# Patient Record
Sex: Female | Born: 1985 | Race: Black or African American | Hispanic: No | Marital: Single | Smoking: Current every day smoker
Health system: Southern US, Community
[De-identification: ages and names within clinical notes are randomized; demographics above are authoritative.]

## PROBLEM LIST (undated history)

## (undated) DIAGNOSIS — F329 Major depressive disorder, single episode, unspecified: Secondary | ICD-10-CM

## (undated) DIAGNOSIS — H53412 Scotoma involving central area, left eye: Secondary | ICD-10-CM

## (undated) DIAGNOSIS — F419 Anxiety disorder, unspecified: Secondary | ICD-10-CM

## (undated) DIAGNOSIS — F32A Depression, unspecified: Secondary | ICD-10-CM

## (undated) HISTORY — PX: TUBAL LIGATION: SHX77

---

## 2013-09-23 ENCOUNTER — Encounter (HOSPITAL_BASED_OUTPATIENT_CLINIC_OR_DEPARTMENT_OTHER): Payer: Self-pay | Admitting: Emergency Medicine

## 2013-09-23 ENCOUNTER — Emergency Department (HOSPITAL_BASED_OUTPATIENT_CLINIC_OR_DEPARTMENT_OTHER)
Admission: EM | Admit: 2013-09-23 | Discharge: 2013-09-23 | Disposition: A | Discharge status: Home / Self Care | Payer: Medicaid Other | Attending: Emergency Medicine | Admitting: Emergency Medicine

## 2013-09-23 DIAGNOSIS — Z8659 Personal history of other mental and behavioral disorders: Secondary | ICD-10-CM | POA: Insufficient documentation

## 2013-09-23 DIAGNOSIS — R059 Cough, unspecified: Secondary | ICD-10-CM | POA: Insufficient documentation

## 2013-09-23 DIAGNOSIS — M542 Cervicalgia: Secondary | ICD-10-CM | POA: Insufficient documentation

## 2013-09-23 DIAGNOSIS — R05 Cough: Secondary | ICD-10-CM | POA: Insufficient documentation

## 2013-09-23 DIAGNOSIS — K0889 Other specified disorders of teeth and supporting structures: Secondary | ICD-10-CM

## 2013-09-23 DIAGNOSIS — K089 Disorder of teeth and supporting structures, unspecified: Secondary | ICD-10-CM | POA: Insufficient documentation

## 2013-09-23 DIAGNOSIS — F17200 Nicotine dependence, unspecified, uncomplicated: Secondary | ICD-10-CM | POA: Insufficient documentation

## 2013-09-23 DIAGNOSIS — H9209 Otalgia, unspecified ear: Secondary | ICD-10-CM | POA: Insufficient documentation

## 2013-09-23 HISTORY — DX: Anxiety disorder, unspecified: F41.9

## 2013-09-23 MED ORDER — HYDROCODONE-ACETAMINOPHEN 5-325 MG PO TABS: 2.0000 | ORAL_TABLET | ORAL | Status: DC | PRN

## 2013-09-23 MED ORDER — AMOXICILLIN 500 MG PO CAPS: 500.0000 mg | ORAL_CAPSULE | Freq: Three times a day (TID) | ORAL | Status: AC

## 2013-09-23 MED ORDER — DOCUSATE SODIUM 100 MG PO CAPS
100.0000 mg | ORAL_CAPSULE | Freq: Once | ORAL | Status: AC
  Administered 2013-09-23: 100 mg via ORAL
  Filled 2013-09-23: qty 1

## 2013-09-23 NOTE — ED Provider Notes (Signed)
Medical screening examination/treatment/procedure(s) were performed by non-physician practitioner and as supervising physician I was immediately available for consultation/collaboration.     Geoffery Lyonsouglas Zaheer Wageman, MD 09/23/13 [phone]

## 2013-09-23 NOTE — Discharge Instructions (Signed)

## 2013-09-23 NOTE — ED Provider Notes (Signed)
CSN: [id number]     Arrival date & time 09/23/13  1153 History   First MD Initiated Contact with Patient 09/23/13 1214     Chief Complaint  Patient presents with  . congestion ear pain neck pain      (Consider location/radiation/quality/duration/timing/severity/associated sxs/prior Treatment) Patient is a 28 y.o. female presenting with cough. The history is provided by the patient. No language interpreter was used.  Cough Cough characteristics:  Non-productive Severity:  Moderate Onset quality:  Gradual Timing:  Constant Progression:  Worsening Chronicity:  New Smoker: no   Relieved by:  Nothing Worsened by:  Nothing tried Ineffective treatments:  None tried Associated symptoms: ear pain and rhinorrhea   Associated symptoms: no fever     Past Medical History  Diagnosis Date  . Anxiety    No past surgical history on file. No family history on file. History  Substance Use Topics  . Smoking status: Current Every Day Smoker  . Smokeless tobacco: Not on file  . Alcohol Use: Yes     Comment: occ   OB History   Grav Para Term Preterm Abortions TAB SAB Ect Mult Living                 Review of Systems  Constitutional: Negative for fever.  HENT: Positive for dental problem, ear pain and rhinorrhea.   Respiratory: Positive for cough.   All other systems reviewed and are negative.      Allergies  Review of patient's allergies indicates no known allergies.  Home Medications  No current outpatient prescriptions on file. BP 154/92  Pulse 88  Temp(Src) 98.3 F (36.8 C) (Oral)  Resp 14  Ht 4\' 11"  (1.499 m)  Wt 105 lb (47.628 kg)  BMI 21.20 kg/m2  SpO2 100%  LMP 09/10/2013 Physical Exam  Nursing note and vitals reviewed. Constitutional: She appears well-developed.  HENT:  Head: Normocephalic and atraumatic.  Right Ear: External ear normal.  Left Ear: External ear normal.  Mouth/Throat: Oropharynx is clear and moist.  Right tm occluded with wax,  Broken  posterior upper molar right  Eyes: Conjunctivae are normal. Pupils are equal, round, and reactive to light.  Neck: Normal range of motion. Neck supple.  Cardiovascular: Normal rate and normal heart sounds.   Pulmonary/Chest: Effort normal.  Abdominal: Soft.  Musculoskeletal: Normal range of motion.  Neurological: She is alert.  Skin: Skin is warm.    ED Course  Procedures (including critical care time) Labs Review Labs Reviewed - No data to display Imaging Review No results found.   EKG Interpretation None      MDM   Final diagnoses:  Toothache    amoxicillian and hydrocodone    Elson AreasLeslie K Reinaldo Helt, PA-C 09/23/13 1337

## 2013-09-23 NOTE — ED Notes (Signed)
Several days of congestion ,sinus pressure, right ear pain that radiates down to neck.

## 2013-11-18 ENCOUNTER — Emergency Department (HOSPITAL_BASED_OUTPATIENT_CLINIC_OR_DEPARTMENT_OTHER)
Admission: EM | Admit: 2013-11-18 | Discharge: 2013-11-18 | Disposition: A | Discharge status: Home / Self Care | Payer: Medicaid Other | Attending: Emergency Medicine | Admitting: Emergency Medicine

## 2013-11-18 ENCOUNTER — Encounter (HOSPITAL_BASED_OUTPATIENT_CLINIC_OR_DEPARTMENT_OTHER): Payer: Self-pay | Admitting: Emergency Medicine

## 2013-11-18 DIAGNOSIS — Z8659 Personal history of other mental and behavioral disorders: Secondary | ICD-10-CM | POA: Insufficient documentation

## 2013-11-18 DIAGNOSIS — F17200 Nicotine dependence, unspecified, uncomplicated: Secondary | ICD-10-CM | POA: Insufficient documentation

## 2013-11-18 DIAGNOSIS — D649 Anemia, unspecified: Secondary | ICD-10-CM

## 2013-11-18 DIAGNOSIS — W57XXXA Bitten or stung by nonvenomous insect and other nonvenomous arthropods, initial encounter: Secondary | ICD-10-CM

## 2013-11-18 DIAGNOSIS — S90569A Insect bite (nonvenomous), unspecified ankle, initial encounter: Secondary | ICD-10-CM | POA: Insufficient documentation

## 2013-11-18 DIAGNOSIS — Y939 Activity, unspecified: Secondary | ICD-10-CM | POA: Insufficient documentation

## 2013-11-18 DIAGNOSIS — J029 Acute pharyngitis, unspecified: Secondary | ICD-10-CM | POA: Insufficient documentation

## 2013-11-18 DIAGNOSIS — Y929 Unspecified place or not applicable: Secondary | ICD-10-CM | POA: Insufficient documentation

## 2013-11-18 LAB — CBC WITH DIFFERENTIAL/PLATELET
BASOS ABS: 0 10*3/uL (ref 0.0–0.1)
Basophils Relative: 0 % (ref 0–1)
Eosinophils Absolute: 0.3 10*3/uL (ref 0.0–0.7)
Eosinophils Relative: 5 % (ref 0–5)
HCT: 27.7 % — ABNORMAL LOW (ref 36.0–46.0)
Hemoglobin: 9 g/dL — ABNORMAL LOW (ref 12.0–15.0)
LYMPHS ABS: 2.4 10*3/uL (ref 0.7–4.0)
LYMPHS PCT: 36 % (ref 12–46)
MCH: 28.1 pg (ref 26.0–34.0)
MCHC: 32.5 g/dL (ref 30.0–36.0)
MCV: 86.6 fL (ref 78.0–100.0)
Monocytes Absolute: 0.5 10*3/uL (ref 0.1–1.0)
Monocytes Relative: 8 % (ref 3–12)
NEUTROS ABS: 3.4 10*3/uL (ref 1.7–7.7)
NEUTROS PCT: 51 % (ref 43–77)
PLATELETS: 241 10*3/uL (ref 150–400)
RBC: 3.2 MIL/uL — AB (ref 3.87–5.11)
RDW: 14.4 % (ref 11.5–15.5)
WBC: 6.7 10*3/uL (ref 4.0–10.5)

## 2013-11-18 LAB — RAPID STREP SCREEN (MED CTR MEBANE ONLY): STREPTOCOCCUS, GROUP A SCREEN (DIRECT): NEGATIVE

## 2013-11-18 MED ORDER — LORATADINE 10 MG PO TABS: 10.0000 mg | ORAL_TABLET | Freq: Every day | ORAL | Status: DC

## 2013-11-18 MED ORDER — FERROUS SULFATE 325 (65 FE) MG PO TABS: 325.0000 mg | ORAL_TABLET | Freq: Every day | ORAL | Status: DC

## 2013-11-18 MED ORDER — MUPIROCIN CALCIUM 2 % EX CREA: 1.0000 "application " | TOPICAL_CREAM | Freq: Two times a day (BID) | CUTANEOUS | Status: DC

## 2013-11-18 NOTE — ED Notes (Signed)
rx x 3 given for claritin, iron and bactroban- pt d/c home with friend

## 2013-11-18 NOTE — ED Provider Notes (Signed)
CSN: [id number]     Arrival date & time 11/18/13  1812 History   First MD Initiated Contact with Patient 11/18/13 1822     Chief Complaint  Patient presents with  . Sore Throat     (Consider location/radiation/quality/duration/timing/severity/associated sxs/prior Treatment) Patient is a 28 y.o. female presenting with pharyngitis. The history is provided by the patient.  Sore Throat This is a new problem. The current episode started yesterday. The problem occurs constantly. The problem has been gradually worsening. Associated symptoms include fatigue, headaches, a rash, a sore throat and swollen glands. Pertinent negatives include no abdominal pain, anorexia, change in bowel habit, congestion, coughing, fever, myalgias, nausea, urinary symptoms, visual change or vomiting. Nothing aggravates the symptoms. She has tried acetaminophen and NSAIDs for the symptoms. The treatment provided mild relief.   Stephanie Jefferson is a 28 y.o. female who presents to the ED with a sore throat that started 2 days ago. She states that she is taking OTC medication without relief. She also complains of areas on her lower legs that are rased rash that may be insect bites. Patient states that she is tired all the time.    Past Medical History  Diagnosis Date  . Anxiety    Past Surgical History  Procedure Laterality Date  . Cesarean section    . Tubal ligation     History reviewed. No pertinent family history. History  Substance Use Topics  . Smoking status: Current Every Day Smoker -- 0.50 packs/day    Types: Cigarettes  . Smokeless tobacco: Not on file  . Alcohol Use: Yes     Comment: occ   OB History   Grav Para Term Preterm Abortions TAB SAB Ect Mult Living                 Review of Systems  Constitutional: Positive for fatigue. Negative for fever.  HENT: Positive for sore throat. Negative for congestion, ear pain, facial swelling and sinus pressure.   Eyes: Negative for visual disturbance.   Respiratory: Negative for cough.   Gastrointestinal: Negative for nausea, vomiting, abdominal pain, anorexia and change in bowel habit.  Genitourinary: Negative for dysuria, urgency, frequency and vaginal discharge.  Musculoskeletal: Negative for myalgias.  Skin: Positive for rash.  Neurological: Positive for headaches.  Psychiatric/Behavioral: Negative for confusion. The patient is not nervous/anxious.       Allergies  Review of patient's allergies indicates no known allergies.  Home Medications   Prior to Admission medications   Medication Sig Start Date End Date Taking? Authorizing Provider  HYDROcodone-acetaminophen (NORCO/VICODIN) 5-325 MG per tablet Take 2 tablets by mouth every 4 (four) hours as needed. 09/23/13   Elson AreasLeslie K Sofia, PA-C   BP 124/70  Pulse 83  Temp(Src) 98.3 F (36.8 C) (Oral)  Resp 16  Ht 4\' 11"  (1.499 m)  Wt 105 lb (47.628 kg)  BMI 21.20 kg/m2  SpO2 100%  LMP 11/11/2013 Physical Exam  Nursing note and vitals reviewed. Constitutional: She is oriented to person, place, and time. She appears well-developed and well-nourished. No distress.  HENT:  Head: Normocephalic and atraumatic.  Right Ear: Tympanic membrane normal.  Left Ear: Tympanic membrane normal.  Nose: Nose normal.  Mouth/Throat: Uvula is midline and mucous membranes are normal. Posterior oropharyngeal erythema present.  Eyes: EOM are normal.  Neck: Neck supple.  Cardiovascular: Normal rate and regular rhythm.   Pulmonary/Chest: Effort normal. She has no wheezes. She has no rales.  Abdominal: Soft. Bowel sounds are normal. There is  no tenderness.  Musculoskeletal: Normal range of motion.  Lymphadenopathy:    She has no cervical adenopathy.  Neurological: She is alert and oriented to person, place, and time. No cranial nerve deficit.  Skin: Skin is warm and dry.  There are small raised areas to the lower extremities that itch and appear as insect bites.  Psychiatric: She has a normal  mood and affect. Her behavior is normal.    ED Course  Procedures (including critical care time) Labs Review Results for orders placed during the hospital encounter of 11/18/13 (from the past 24 hour(s))  RAPID STREP SCREEN     Status: None   Collection Time    11/18/13  6:17 PM      Result Value Ref Range   Streptococcus, Group A Screen (Direct) NEGATIVE  NEGATIVE  CBC WITH DIFFERENTIAL     Status: Abnormal   Collection Time    11/18/13  7:30 PM      Result Value Ref Range   WBC 6.7  4.0 - 10.5 K/uL   RBC 3.20 (*) 3.87 - 5.11 MIL/uL   Hemoglobin 9.0 (*) 12.0 - 15.0 g/dL   HCT 16.127.7 (*) 09.636.0 - 04.546.0 %   MCV 86.6  78.0 - 100.0 fL   MCH 28.1  26.0 - 34.0 pg   MCHC 32.5  30.0 - 36.0 g/dL   RDW 40.914.4  81.111.5 - 91.415.5 %   Platelets 241  150 - 400 K/uL   Neutrophils Relative % 51  43 - 77 %   Neutro Abs 3.4  1.7 - 7.7 K/uL   Lymphocytes Relative 36  12 - 46 %   Lymphs Abs 2.4  0.7 - 4.0 K/uL   Monocytes Relative 8  3 - 12 %   Monocytes Absolute 0.5  0.1 - 1.0 K/uL   Eosinophils Relative 5  0 - 5 %   Eosinophils Absolute 0.3  0.0 - 0.7 K/uL   Basophils Relative 0  0 - 1 %   Basophils Absolute 0.0  0.0 - 0.1 K/uL    MDM  28 y.o. female with sore throat x 2 days and feeling tired all the time. Will treat for anemia, viral pharyngitis and insect bites. She will follow up with her PCP or return here as needed. I have reviewed this patient's vital signs, nurses notes, appropriate labs and imaging.  I have discussed findings and plan of care with the patient and she voices understanding. Stable for discharge without further screening at this time.   Medication List    TAKE these medications       ferrous sulfate 325 (65 FE) MG tablet  Take 1 tablet (325 mg total) by mouth daily.     loratadine 10 MG tablet  Commonly known as:  CLARITIN  Take 1 tablet (10 mg total) by mouth daily.     mupirocin cream 2 %  Commonly known as:  BACTROBAN  Apply 1 application topically 2 (two) times  daily.      ASK your doctor about these medications       HYDROcodone-acetaminophen 5-325 MG per tablet  Commonly known as:  NORCO/VICODIN  Take 2 tablets by mouth every 4 (four) hours as needed.         [id number] CircleHope MedwayM Stephanie Jefferson, [place] 11/20/13 [phone]

## 2013-11-18 NOTE — Discharge Instructions (Signed)
It is important that you follow up with your doctor for your anemia. Take the medications as directed. Return as needed for problems.

## 2013-11-18 NOTE — ED Notes (Signed)
Pt c/o sore throat x2 days 

## 2013-11-20 LAB — CULTURE, GROUP A STREP

## 2013-11-20 NOTE — ED Provider Notes (Signed)
  Medical screening examination/treatment/procedure(s) were performed by non-physician practitioner and as supervising physician I was immediately available for consultation/collaboration.      Cledith Kamiya, MD 11/20/13 1534 

## 2013-12-28 ENCOUNTER — Emergency Department (HOSPITAL_BASED_OUTPATIENT_CLINIC_OR_DEPARTMENT_OTHER): Payer: Medicaid Other

## 2013-12-28 ENCOUNTER — Emergency Department (HOSPITAL_BASED_OUTPATIENT_CLINIC_OR_DEPARTMENT_OTHER)
Admission: EM | Admit: 2013-12-28 | Discharge: 2013-12-28 | Disposition: A | Discharge status: Home / Self Care | Payer: Medicaid Other | Attending: Emergency Medicine | Admitting: Emergency Medicine

## 2013-12-28 ENCOUNTER — Encounter (HOSPITAL_BASED_OUTPATIENT_CLINIC_OR_DEPARTMENT_OTHER): Payer: Self-pay | Admitting: Emergency Medicine

## 2013-12-28 DIAGNOSIS — F17200 Nicotine dependence, unspecified, uncomplicated: Secondary | ICD-10-CM | POA: Insufficient documentation

## 2013-12-28 DIAGNOSIS — H538 Other visual disturbances: Secondary | ICD-10-CM | POA: Insufficient documentation

## 2013-12-28 DIAGNOSIS — R519 Headache, unspecified: Secondary | ICD-10-CM

## 2013-12-28 DIAGNOSIS — Z3202 Encounter for pregnancy test, result negative: Secondary | ICD-10-CM | POA: Insufficient documentation

## 2013-12-28 DIAGNOSIS — Z79899 Other long term (current) drug therapy: Secondary | ICD-10-CM | POA: Insufficient documentation

## 2013-12-28 DIAGNOSIS — R51 Headache: Secondary | ICD-10-CM | POA: Insufficient documentation

## 2013-12-28 DIAGNOSIS — Z792 Long term (current) use of antibiotics: Secondary | ICD-10-CM | POA: Insufficient documentation

## 2013-12-28 DIAGNOSIS — Z8659 Personal history of other mental and behavioral disorders: Secondary | ICD-10-CM | POA: Insufficient documentation

## 2013-12-28 LAB — PREGNANCY, URINE: PREG TEST UR: NEGATIVE

## 2013-12-28 MED ORDER — METOCLOPRAMIDE HCL 10 MG PO TABS
10.0000 mg | ORAL_TABLET | Freq: Once | ORAL | Status: AC
  Administered 2013-12-28: 10 mg via ORAL
  Filled 2013-12-28: qty 1

## 2013-12-28 MED ORDER — DIPHENHYDRAMINE HCL 25 MG PO CAPS
25.0000 mg | ORAL_CAPSULE | Freq: Once | ORAL | Status: AC
  Administered 2013-12-28: 25 mg via ORAL
  Filled 2013-12-28: qty 1

## 2013-12-28 NOTE — ED Notes (Signed)
Pt ambulating to restroom at this time.

## 2013-12-28 NOTE — ED Provider Notes (Signed)
CSN: [id number]     Arrival date & time 12/28/13  1415 History  This chart was scribed for Stephanie Argyle, MD by Quintella Reichert, ED scribe.  This patient was seen in room MH08/MH08 and the patient's care was started at 3:23 PM.    Chief Complaint  Patient presents with  . Eye Problem     Patient is a 28 y.o. female presenting with eye problem. The history is provided by the patient. No language interpreter was used.  Eye Problem Location:  L eye Quality: blurry vision. Severity:  Mild Duration:  4 days Timing:  Constant Chronicity:  New Context comment:  Slept with arm over eye Associated symptoms: headaches     HPI Comments: Stephanie Jefferson is a 28 y.o. female who presents to the Emergency Department complaining of recurrent headaches for the past two years. She reports experiencing headaches 4-5 days per week, but does not currently have a headache. She reports a sharp pain on her in her right temporal region beginning a few minutes ago and rates this pain as a 7/10 in severity. Patient says this pain is similar to what she typically experiences before developing a headache. She takes OTC ibuprofen, which helps relieve the pain, but she is concerned that she takes it too frequently. She denies fever, vomiting, diarrhea, dysuria, or any other associated symptoms. Patient has never received imaging of her head.    She also complains of blurry vision in her left eye. Patient states that she slept with her arm pressing against this eye 3-4 nights ago and awoke with mild blurred vision in that eye the next morning. She denies eye pain or loss of vision. She does not see an optometrist.   Past Medical History  Diagnosis Date  . Anxiety    Past Surgical History  Procedure Laterality Date  . Cesarean section    . Tubal ligation     No family history on file. History  Substance Use Topics  . Smoking status: Current Every Day Smoker -- 0.50 packs/day    Types: Cigarettes  .  Smokeless tobacco: Not on file  . Alcohol Use: Yes     Comment: occ   OB History   Grav Para Term Preterm Abortions TAB SAB Ect Mult Living                 Review of Systems  Constitutional: Negative for fever and chills.  HENT: Negative for congestion, rhinorrhea and sore throat.   Eyes: Positive for visual disturbance.  Respiratory: Negative for cough and shortness of breath.   Cardiovascular: Negative for chest pain and leg swelling.  Gastrointestinal: Negative for abdominal pain.  Genitourinary: Negative for dysuria.  Musculoskeletal: Negative for back pain and neck pain.  Skin: Negative for rash.  Neurological: Positive for headaches.  Hematological: Does not bruise/bleed easily.  Psychiatric/Behavioral: Negative for confusion.      Allergies  Review of patient's allergies indicates no known allergies.  Home Medications   Prior to Admission medications   Medication Sig Start Date End Date Taking? Authorizing Provider  ferrous sulfate 325 (65 FE) MG tablet Take 1 tablet (325 mg total) by mouth daily. 11/18/13   Hope Orlene Och, NP  HYDROcodone-acetaminophen (NORCO/VICODIN) 5-325 MG per tablet Take 2 tablets by mouth every 4 (four) hours as needed. 09/23/13   Elson Areas, PA-C  loratadine (CLARITIN) 10 MG tablet Take 1 tablet (10 mg total) by mouth daily. 11/18/13   Hope Orlene Och, NP  mupirocin cream (BACTROBAN) 2 % Apply 1 application topically 2 (two) times daily. 11/18/13   Hope Orlene OchM Neese, NP   BP 120/75  Pulse 73  Temp(Src) 98 F (36.7 C) (Oral)  Resp 16  Ht 4\' 11"  (1.499 m)  Wt 103 lb (46.72 kg)  BMI 20.79 kg/m2  SpO2 100%  Physical Exam  Nursing note and vitals reviewed. Constitutional: She is oriented to person, place, and time. She appears well-developed and well-nourished. No distress.  HENT:  Head: Normocephalic and atraumatic.  Right Ear: External ear normal.  Left Ear: External ear normal.  Mouth/Throat: Oropharynx is clear and moist.  TM's clear  bilaterally  Eyes: Conjunctivae and EOM are normal. Pupils are equal, round, and reactive to light.  20/20 in right eye 20/50 in left eye  Normal appearing conjunctiva. Normal peripheral fields.   Neck: Normal range of motion. Neck supple. No tracheal deviation present.  Cardiovascular: Normal rate, regular rhythm and normal heart sounds.  Exam reveals no gallop and no friction rub.   No murmur heard. Pulmonary/Chest: Effort normal and breath sounds normal. No respiratory distress. She has no wheezes. She has no rales.  Abdominal: Soft. Bowel sounds are normal. She exhibits no distension and no mass. There is no tenderness. There is no rebound and no guarding.  Musculoskeletal: Normal range of motion.  Neurological: She is alert and oriented to person, place, and time. She has normal strength. No sensory deficit.  Skin: Skin is warm and dry.  Psychiatric: She has a normal mood and affect. Her behavior is normal.    ED Course  Procedures (including critical care time) DIAGNOSTIC STUDIES: Oxygen Saturation is 100% on room air, normal by my interpretation.    COORDINATION OF CARE: 3:29 PM-Discussed treatment plan which includes head CT, migraine cocktail, and fluorescein exam with pt at bedside and pt agreed to plan.     Labs Review Labs Reviewed - No data to display  Imaging Review No results found.   EKG Interpretation None      MDM   Final diagnoses:  Headache  Blurry vision, left eye    3:57 PM 28 y.o. female w hx of HA's and blurry vision in Left eye for 4-5 days after sleeping w/ her arm over her eyes. Has had this before and it went away spontaneously. Will get CT of head do to chronic headaches for 2 years. Doubt corneal abrasion given absence of the eye pain. She did not want an IV or IV fluids. Will give her an oral headache cocktail. She denies any other visual disturbances such as seeing spots or visual field loss. Doubt detached retina, stroke, or other serious  ocular pathology.  5:02 PM: I interpreted/reviewed the labs and/or imaging which were non-contributory.   I have discussed the diagnosis/risks/treatment options with the patient and believe the pt to be eligible for discharge home to follow-up with an optometrist next week for eye exam. We also discussed returning to the ED immediately if new or worsening sx occur. We discussed the sx which are most concerning (e.g., fever, eye pain, redness, worsening vision) that necessitate immediate return. Medications administered to the patient during their visit and any new prescriptions provided to the patient are listed below.  Medications given during this visit Medications  metoCLOPramide (REGLAN) tablet 10 mg (10 mg Oral Given by Other 12/28/13 1542)  diphenhydrAMINE (BENADRYL) capsule 25 mg (25 mg Oral Given 12/28/13 1541)    New Prescriptions   No medications on file  I personally performed the services described in this documentation, which was scribed in my presence. The recorded information has been reviewed and is accurate.    Stephanie ArgyleForrest S Areona Homer, MD 12/28/13 [phone]

## 2013-12-28 NOTE — ED Notes (Signed)
MD at bedside. 

## 2013-12-28 NOTE — Discharge Instructions (Signed)
Blurred Vision °You have been seen today complaining of blurred vision. This means you have a loss of ability to see small details.  °CAUSES  °Blurred vision can be a symptom of underlying eye problems, such as: °· Aging of the eye (presbyopia). °· Glaucoma. °· Cataracts. °· Eye infection. °· Eye-related migraine. °· Diabetes mellitus. °· Fatigue. °· Migraine headaches. °· High blood pressure. °· Breakdown of the back of the eye (macular degeneration). °· Problems caused by some medications. °The most common cause of blurred vision is the need for eyeglasses or a new prescription. Today in the emergency department, no cause for your blurred vision can be found. °SYMPTOMS  °Blurred vision is the loss of visual sharpness and detail (acuity). °DIAGNOSIS  °Should blurred vision continue, you should see your caregiver. If your caregiver is your primary care physician, he or she may choose to refer you to another specialist.  °TREATMENT  °Do not ignore your blurred vision. Make sure to have it checked out to see if further treatment or referral is necessary. °SEEK MEDICAL CARE IF:  °You are unable to get into a specialist so we can help you with a referral. °SEEK IMMEDIATE MEDICAL CARE IF: °You have severe eye pain, severe headache, or sudden loss of vision. °MAKE SURE YOU:  °· Understand these instructions. °· Will watch your condition. °· Will get help right away if you are not doing well or get worse. °Document Released: 07/06/2003 Document Revised: 09/25/2011 Document Reviewed: 02/05/2008 °[employer]® Patient Information ©2014 [employer]. ° °

## 2013-12-28 NOTE — ED Notes (Signed)
Pt reports that she has had blurry vision in the left eye x 3 days.  Reports HA. Also reports right ear pain.

## 2013-12-28 NOTE — ED Notes (Signed)
Patient states that 4 days ago she woke up with blurry vision in her right eye, headache and ear ache on the right side as well.

## 2014-08-10 ENCOUNTER — Emergency Department (HOSPITAL_BASED_OUTPATIENT_CLINIC_OR_DEPARTMENT_OTHER): Payer: Medicaid Other

## 2014-08-10 ENCOUNTER — Emergency Department (HOSPITAL_BASED_OUTPATIENT_CLINIC_OR_DEPARTMENT_OTHER)
Admission: EM | Admit: 2014-08-10 | Discharge: 2014-08-10 | Disposition: A | Discharge status: Home / Self Care | Payer: Medicaid Other | Attending: Emergency Medicine | Admitting: Emergency Medicine

## 2014-08-10 ENCOUNTER — Encounter (HOSPITAL_BASED_OUTPATIENT_CLINIC_OR_DEPARTMENT_OTHER): Payer: Self-pay | Admitting: *Deleted

## 2014-08-10 DIAGNOSIS — Z720 Tobacco use: Secondary | ICD-10-CM | POA: Diagnosis not present

## 2014-08-10 DIAGNOSIS — R202 Paresthesia of skin: Secondary | ICD-10-CM

## 2014-08-10 DIAGNOSIS — Z8659 Personal history of other mental and behavioral disorders: Secondary | ICD-10-CM | POA: Diagnosis not present

## 2014-08-10 DIAGNOSIS — M79671 Pain in right foot: Secondary | ICD-10-CM | POA: Diagnosis present

## 2014-08-10 DIAGNOSIS — Z79899 Other long term (current) drug therapy: Secondary | ICD-10-CM | POA: Insufficient documentation

## 2014-08-10 DIAGNOSIS — R52 Pain, unspecified: Secondary | ICD-10-CM

## 2014-08-10 NOTE — ED Provider Notes (Signed)
CSN: [id number]     Arrival date & time 08/10/14  1528 History   First MD Initiated Contact with Patient 08/10/14 1535     Chief Complaint  Patient presents with  . Foot Pain     (Consider location/radiation/quality/duration/timing/severity/associated sxs/prior Treatment) HPI Comments: Pt comes in today with c/o right heel numbness paresthesia for the last 4 days. Pt states that she she has not had any injury to the area. She states that she does have some pain to the area intermittently. No redness or warmth noted. Not having any problems with walking. Pt states that she has over the last couple of months has intermittent numbness to different extremities  The history is provided by the patient. No language interpreter was used.    Past Medical History  Diagnosis Date  . Anxiety    Past Surgical History  Procedure Laterality Date  . Cesarean section    . Tubal ligation     History reviewed. No pertinent family history. History  Substance Use Topics  . Smoking status: Current Every Day Smoker -- 0.50 packs/day    Types: Cigarettes  . Smokeless tobacco: Not on file  . Alcohol Use: Yes     Comment: occ   OB History    No data available     Review of Systems  All other systems reviewed and are negative.     Allergies  Review of patient's allergies indicates no known allergies.  Home Medications   Prior to Admission medications   Medication Sig Start Date End Date Taking? Authorizing Provider  ferrous sulfate 325 (65 FE) MG tablet Take 1 tablet (325 mg total) by mouth daily. 11/18/13   Hope Orlene Och, NP  HYDROcodone-acetaminophen (NORCO/VICODIN) 5-325 MG per tablet Take 2 tablets by mouth every 4 (four) hours as needed. 09/23/13   Elson Areas, PA-C  loratadine (CLARITIN) 10 MG tablet Take 1 tablet (10 mg total) by mouth daily. 11/18/13   Hope Orlene Och, NP  mupirocin cream (BACTROBAN) 2 % Apply 1 application topically 2 (two) times daily. 11/18/13   Hope Orlene Och, NP   BP  156/85 mmHg  Pulse 78  Temp(Src) 98.5 F (36.9 C) (Oral)  Resp 16  Ht  (1.499 m)  Wt 109 lb (49.442 kg)  BMI 22.00 kg/m2  SpO2 100%  LMP 08/10/2014 Physical Exam  Constitutional: She is oriented to person, place, and time. She appears well-developed and well-nourished.  HENT:  Head: Normocephalic and atraumatic.  Cardiovascular: Normal rate and regular rhythm.   Pulmonary/Chest: Effort normal.  Musculoskeletal: Normal range of motion.  Pulse intact. No redness or warmth noted. Full rom. Good sensation  Neurological: She is alert and oriented to person, place, and time. She exhibits normal muscle tone. Coordination normal.  Skin: Skin is warm and dry.  Nursing note and vitals reviewed.   ED Course  Procedures (including critical care time) Labs Review Labs Reviewed - No data to display  Imaging Review Dg Foot Complete Right  08/10/2014   CLINICAL DATA:  Right foot numbness in the heel in toes for 4 days. No acute injury or pain. Initial encounter.  EXAM: RIGHT FOOT COMPLETE - 3+ VIEW  COMPARISON:  None.  FINDINGS: The mineralization and alignment are normal. There is no evidence of acute fracture or dislocation. The joint spaces are maintained. No focal soft tissue swelling.  IMPRESSION: Normal right foot radiographs.  No evidence of neuropathic change.   Electronically Signed   By: Roxy Horseman  M.D.   On: 08/10/2014 15:59     EKG Interpretation None      MDM   Final diagnoses:  Pain  Paresthesia    Doubt tia/stroke etc. Discussed with pt follow up with pcp do discuss ms. Pt states that she has had some vision problems and numbness in different areas over the last 6-9 months. She has been seen and had multiple tests without any answers. Discussed with pt the possibility of ms and that she needed to follow up with pcp and discuss this    Teressa LowerVrinda Yadhira Mckneely, NP 08/10/14 1630  Richardean Canalavid H Yao, MD 08/10/14 [phone]

## 2014-08-10 NOTE — ED Notes (Signed)
Pt c/o right heel numbness x 4 days

## 2014-08-10 NOTE — Discharge Instructions (Signed)
As discussed you need to follow up with your primary care doctor Paresthesia Paresthesia is an abnormal burning or prickling sensation. This sensation is generally felt in the hands, arms, legs, or feet. However, it may occur in any part of the body. It is usually not painful. The feeling may be described as:  Tingling or numbness.  "Pins and needles."  Skin crawling.  Buzzing.  Limbs "falling asleep."  Itching. Most people experience temporary (transient) paresthesia at some time in their lives. CAUSES  Paresthesia may occur when you breathe too quickly (hyperventilation). It can also occur without any apparent cause. Commonly, paresthesia occurs when pressure is placed on a nerve. The feeling quickly goes away once the pressure is removed. For some people, however, paresthesia is a long-lasting (chronic) condition caused by an underlying disorder. The underlying disorder may be:  A traumatic, direct injury to nerves. Examples include a:  Broken (fractured) neck.  Fractured skull.  A disorder affecting the brain and spinal cord (central nervous system). Examples include:  Transverse myelitis.  Encephalitis.  Transient ischemic attack.  Multiple sclerosis.  Stroke.  Tumor or blood vessel problems, such as an arteriovenous malformation pressing against the brain or spinal cord.  A condition that damages the peripheral nerves (peripheral neuropathy). Peripheral nerves are not part of the brain and spinal cord. These conditions include:  Diabetes.  Peripheral vascular disease.  Nerve entrapment syndromes, such as carpal tunnel syndrome.  Shingles.  Hypothyroidism.  Vitamin B12 deficiencies.  Alcoholism.  Heavy metal poisoning (lead, arsenic).  Rheumatoid arthritis.  Systemic lupus erythematosus. DIAGNOSIS  Your caregiver will attempt to find the underlying cause of your paresthesia. Your caregiver may:  Take your medical history.  Perform a physical  exam.  Order various lab tests.  Order imaging tests. TREATMENT  Treatment for paresthesia depends on the underlying cause. HOME CARE INSTRUCTIONS  Avoid drinking alcohol.  You may consider massage or acupuncture to help relieve your symptoms.  Keep all follow-up appointments as directed by your caregiver. SEEK IMMEDIATE MEDICAL CARE IF:   You feel weak.  You have trouble walking or moving.  You have problems with speech or vision.  You feel confused.  You cannot control your bladder or bowel movements.  You feel numbness after an injury.  You faint.  Your burning or prickling feeling gets worse when walking.  You have pain, cramps, or dizziness.  You develop a rash. MAKE SURE YOU:  Understand these instructions.  Will watch your condition.  Will get help right away if you are not doing well or get worse. Document Released: 06/23/2002 Document Revised: 09/25/2011 Document Reviewed: 03/24/2011 [facility] Patient Information 2015 [place], [place]. This information is not intended to replace advice given to you by your health care provider. Make sure you discuss any questions you have with your health care provider.

## 2014-08-10 NOTE — ED Notes (Signed)
Patient transported to X-ray 

## 2014-08-10 NOTE — ED Notes (Signed)
D/c home with family- f/u care discussed

## 2015-05-04 ENCOUNTER — Emergency Department (HOSPITAL_BASED_OUTPATIENT_CLINIC_OR_DEPARTMENT_OTHER)
Admission: EM | Admit: 2015-05-04 | Discharge: 2015-05-04 | Disposition: A | Discharge status: Home / Self Care | Payer: Medicaid Other | Attending: Emergency Medicine | Admitting: Emergency Medicine

## 2015-05-04 ENCOUNTER — Encounter (HOSPITAL_BASED_OUTPATIENT_CLINIC_OR_DEPARTMENT_OTHER): Payer: Self-pay

## 2015-05-04 DIAGNOSIS — Z9851 Tubal ligation status: Secondary | ICD-10-CM | POA: Diagnosis not present

## 2015-05-04 DIAGNOSIS — Z8659 Personal history of other mental and behavioral disorders: Secondary | ICD-10-CM | POA: Diagnosis not present

## 2015-05-04 DIAGNOSIS — R109 Unspecified abdominal pain: Secondary | ICD-10-CM | POA: Diagnosis present

## 2015-05-04 DIAGNOSIS — Z720 Tobacco use: Secondary | ICD-10-CM | POA: Diagnosis not present

## 2015-05-04 DIAGNOSIS — Z3202 Encounter for pregnancy test, result negative: Secondary | ICD-10-CM | POA: Diagnosis not present

## 2015-05-04 DIAGNOSIS — K529 Noninfective gastroenteritis and colitis, unspecified: Secondary | ICD-10-CM | POA: Insufficient documentation

## 2015-05-04 LAB — PREGNANCY, URINE: PREG TEST UR: NEGATIVE

## 2015-05-04 LAB — URINALYSIS, ROUTINE W REFLEX MICROSCOPIC
Bilirubin Urine: NEGATIVE
Glucose, UA: NEGATIVE mg/dL
KETONES UR: NEGATIVE mg/dL
Leukocytes, UA: NEGATIVE
NITRITE: NEGATIVE
Protein, ur: NEGATIVE mg/dL
SPECIFIC GRAVITY, URINE: 1.022 (ref 1.005–1.030)
UROBILINOGEN UA: 0.2 mg/dL (ref 0.0–1.0)
pH: 6.5 (ref 5.0–8.0)

## 2015-05-04 LAB — URINE MICROSCOPIC-ADD ON

## 2015-05-04 MED ORDER — DIPHENOXYLATE-ATROPINE 2.5-0.025 MG PO TABS: 1.0000 | ORAL_TABLET | Freq: Four times a day (QID) | ORAL | Status: AC | PRN

## 2015-05-04 NOTE — ED Notes (Signed)
MD at bedside. 

## 2015-05-04 NOTE — ED Notes (Signed)
C/o abd gas, bloating, diarrhea "feels all bubbly" since Saturday

## 2015-05-04 NOTE — ED Provider Notes (Signed)
CSN: [id number]     Arrival date & time 05/04/15  1711 History  By signing my name below, I, Gwenyth Ober, attest that this documentation has been prepared under the direction and in the presence of Nelva Nay, MD.  Electronically Signed: Gwenyth Ober, ED Scribe. 05/04/2015. 6:19 PM.   Chief Complaint  Patient presents with  . Abdominal Pain   The history is provided by the patient. No language interpreter was used.    HPI Comments: Stephanie Jefferson is a 29 y.o. female who presents to the Emergency Department complaining of constant, moderate abdominal pain that started 3 days ago. She states intermittent mild diarrhea, increased flatulence and bloating as associated symptoms. Pt also notes intermittent "bubbling" noises from her abdomen. 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 Pt has possible sick contact with similar symptoms. She has a history of 2 Cesarean sections. Pt denies vomiting and fever.  Past Medical History  Diagnosis Date  . Anxiety    Past Surgical History  Procedure Laterality Date  . Cesarean section    . Tubal ligation     No family history on file. Social History  Substance Use Topics  . Smoking status: Current Every Day Smoker -- 0.50 packs/day    Types: Cigarettes  . Smokeless tobacco: None  . Alcohol Use: Yes     Comment: occ   OB History    No data available     Review of Systems  Constitutional: Negative for fever.  Gastrointestinal: Positive for abdominal pain and diarrhea. Negative for vomiting.  All other systems reviewed and are negative.  Allergies  Review of patient's allergies indicates no known allergies.  Home Medications   Prior to Admission medications   Medication Sig Start Date End Date Taking? Authorizing Provider  diphenoxylate-atropine (LOMOTIL) 2.5-0.025 MG tablet Take 1 tablet by mouth 4 (four) times daily as needed for diarrhea or loose stools. 05/04/15   Nelva Nay, MD   BP 135/93 mmHg  Pulse 92   Temp(Src) 98.1 F (36.7 C) (Oral)  Resp 18  Ht  (1.499 m)  Wt 123 lb (55.792 kg)  BMI 24.83 kg/m2  SpO2 100%  LMP 04/20/2015 Physical Exam Physical Exam  Nursing note and vitals reviewed. Constitutional: She is oriented to person, place, and time. She appears well-developed and well-nourished. No distress.  HENT:  Head: Normocephalic and atraumatic.  Eyes: Pupils are equal, round, and reactive to light.  Neck: Normal range of motion.  Cardiovascular: Normal rate and intact distal pulses.   Pulmonary/Chest: No respiratory distress.  Abdominal: Normal appearance. She exhibits no distension.  Hyperactive bowel sounds.  No rebound or guarding tenderness.  No peritoneal signs.   Musculoskeletal: Normal range of motion.  Neurological: She is alert and oriented to person, place, and time. No cranial nerve deficit.  Skin: Skin is warm and dry. No rash noted.  Psychiatric: She has a normal mood and affect. Her behavior is normal.   ED Course  Procedures   DIAGNOSTIC STUDIES: Oxygen Saturation is 100% on RA, normal by my interpretation.    COORDINATION OF CARE: 6:18 PM Discussed suspicion for viral illness. Discussed treatment plan with pt. Pt agreed to plan.   Labs Review Labs Reviewed  URINALYSIS, ROUTINE W REFLEX MICROSCOPIC (NOT AT [facility]) - Abnormal; Notable for the following:    APPearance CLOUDY (*)    Hgb urine dipstick SMALL (*)    All other components within normal limits  URINE MICROSCOPIC-ADD ON - Abnormal; Notable for the  following:    Squamous Epithelial / LPF FEW (*)    All other components within normal limits  PREGNANCY, URINE    Imaging Review No results found. I have personally reviewed and evaluated these lab results as part of my medical decision-making.   EKG Interpretation None      MDM   Final diagnoses:  Gastroenteritis   I personally performed the services described in this documentation, which was scribed in my presence. The recorded  information has been reviewed and considered.    Nelva Nayobert Lynell Kussman, MD 05/04/15 [phone]

## 2015-05-04 NOTE — Discharge Instructions (Signed)

## 2016-01-11 IMAGING — CT CT HEAD W/O CM
1 series · 16 of 30 positions shown, 20 images · non-contrast
Comparison: None.

CLINICAL DATA: Frontal headaches for 2 years. No known injury.
Blurred vision in the left eye for 4 days.

EXAM:
CT HEAD WITHOUT CONTRAST
TECHNIQUE: Contiguous axial images were obtained from the base of the skull
through the vertex without intravenous contrast.

[Series 2: head 4.8 h37s · axial · 0.45mm/px · z∈[-157,-5]mm · 16 of 36 slices shown, 20 images]
[im 2/36  brain]
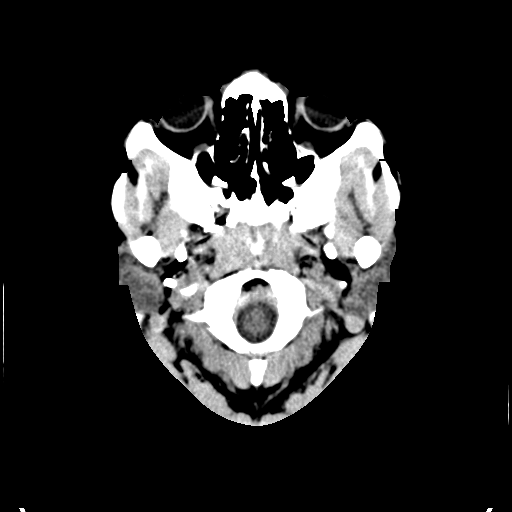
[im 2/36  bone]
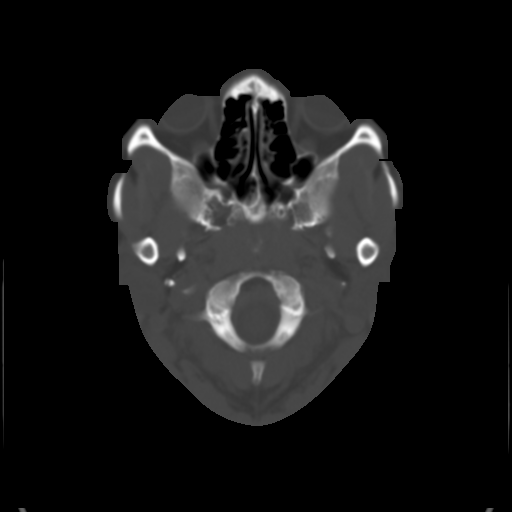
[im 4/36  brain]
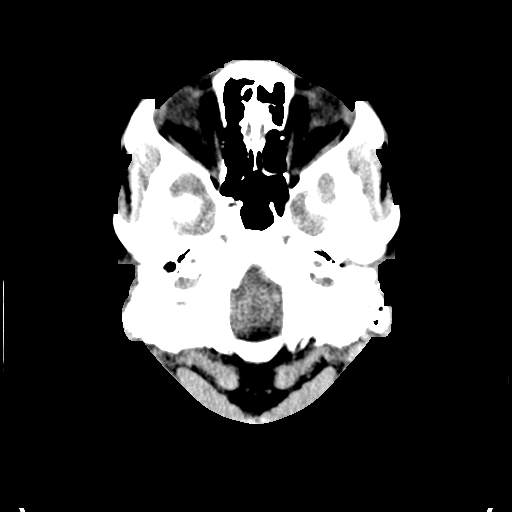
[im 7/36  brain]
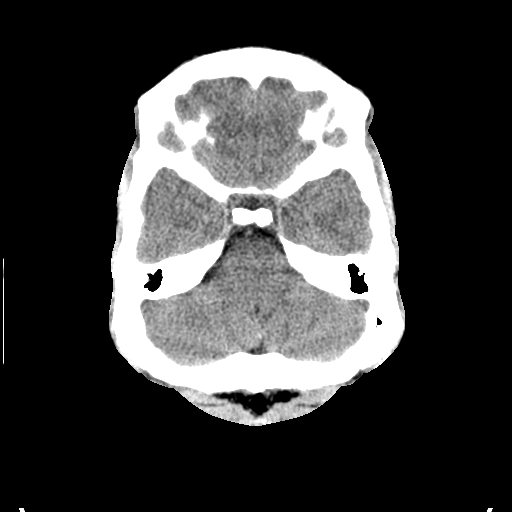
[im 9/36  brain]
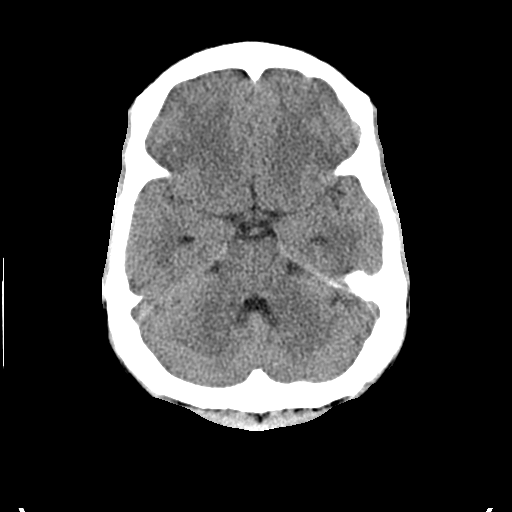
[im 10/36  brain]
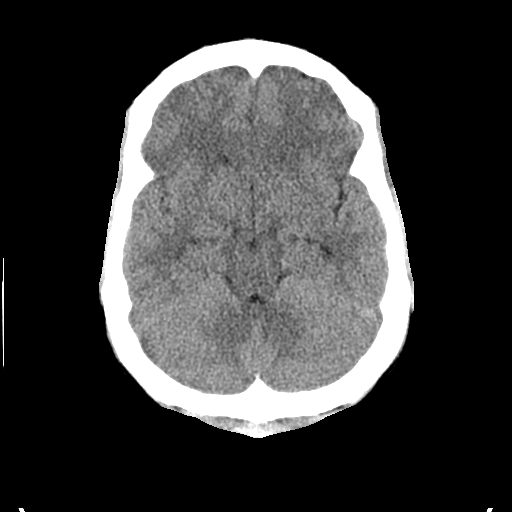
[im 10/36  bone]
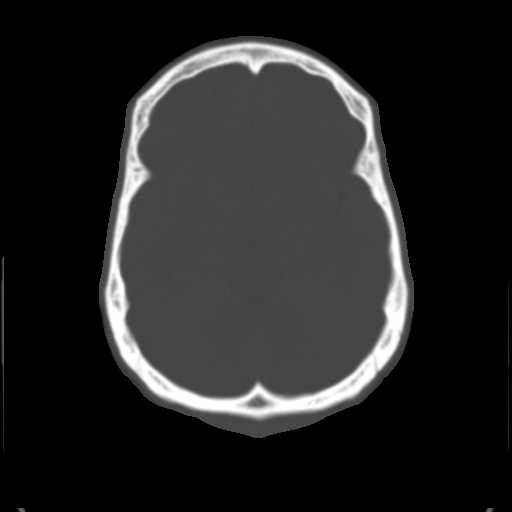
[im 13/36  brain]
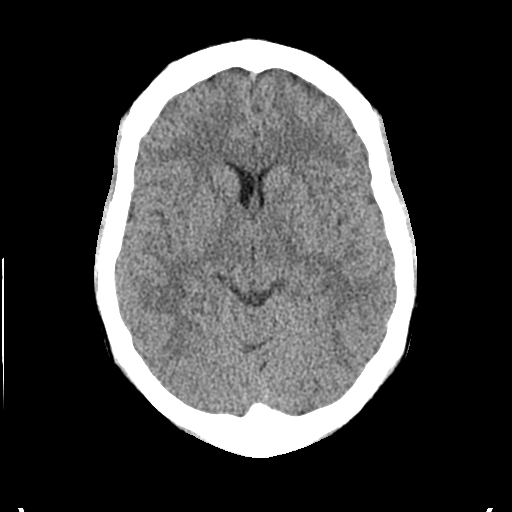
[im 15/36  brain]
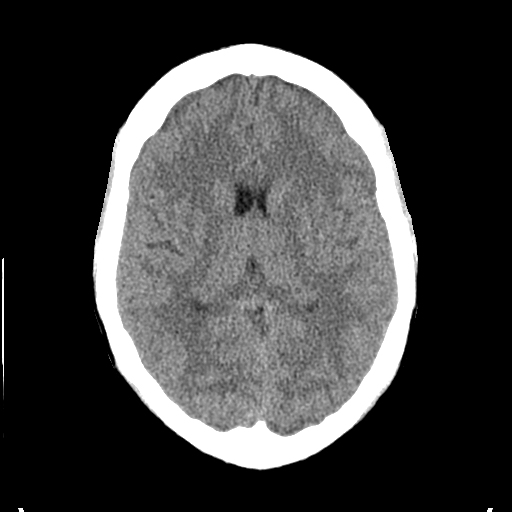
[im 17/36  brain]
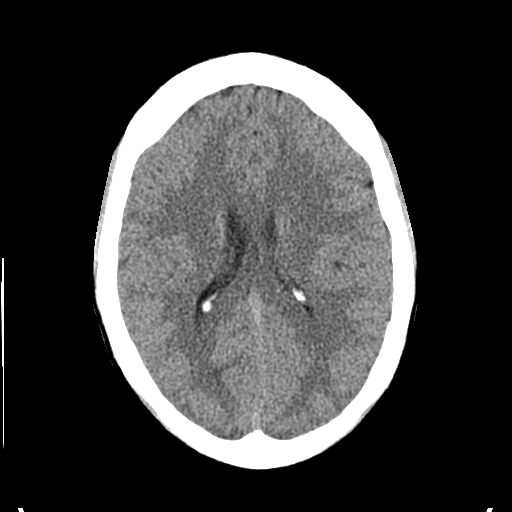
[im 19/36  brain]
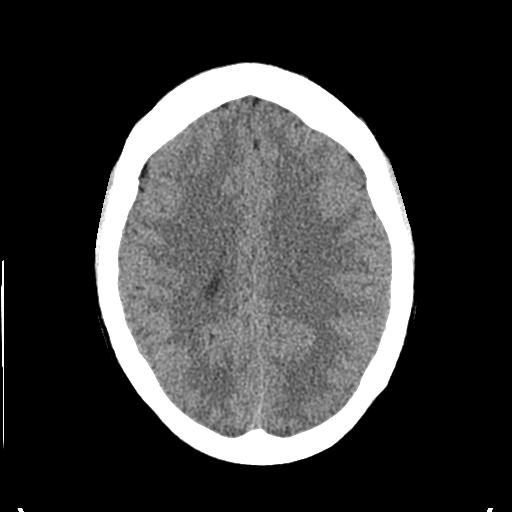
[im 19/36  bone]
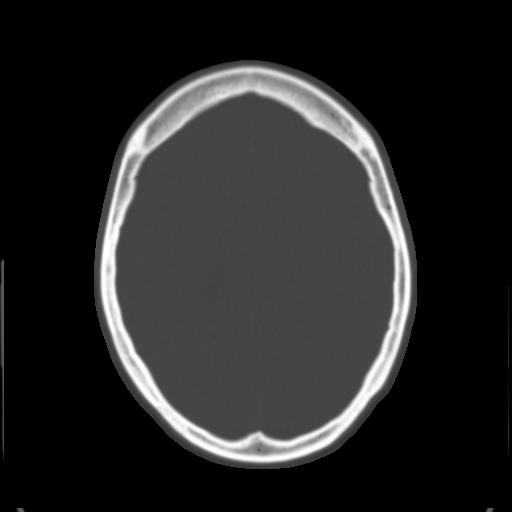
[im 21/36  brain]
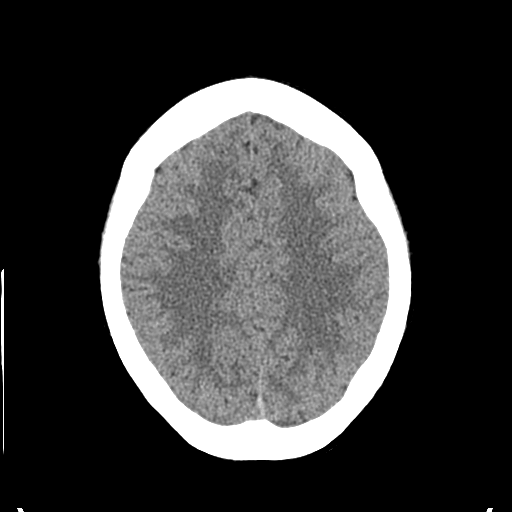
[im 23/36  brain]
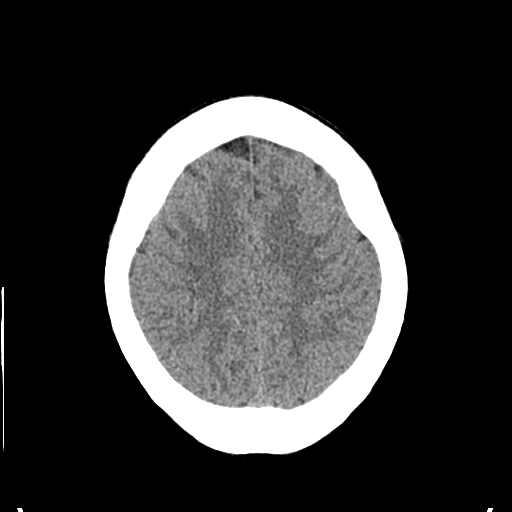
[im 26/36  brain]
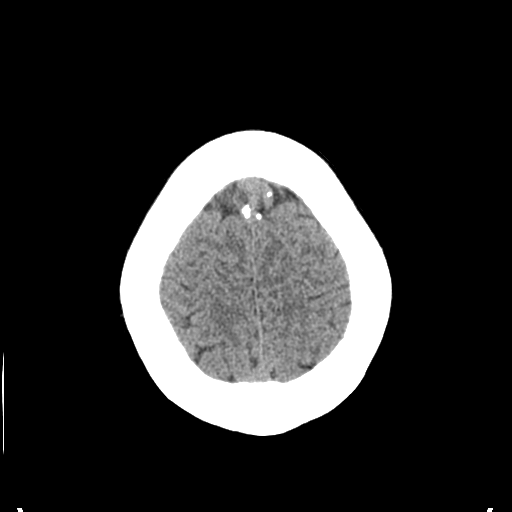
[im 27/36  brain]
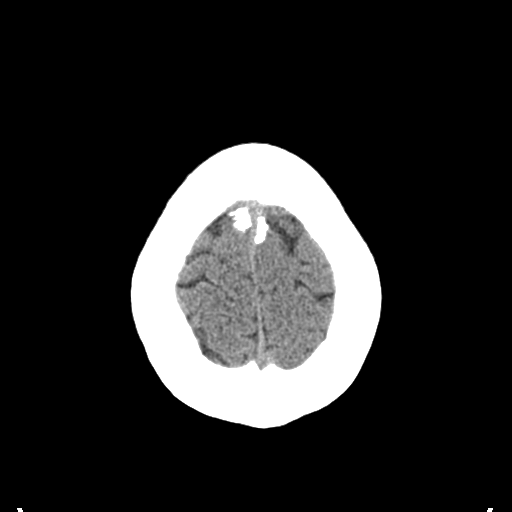
[im 27/36  bone]
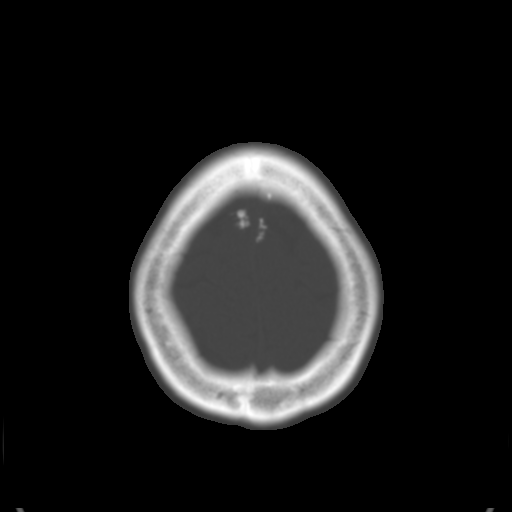
[im 29/36  brain]
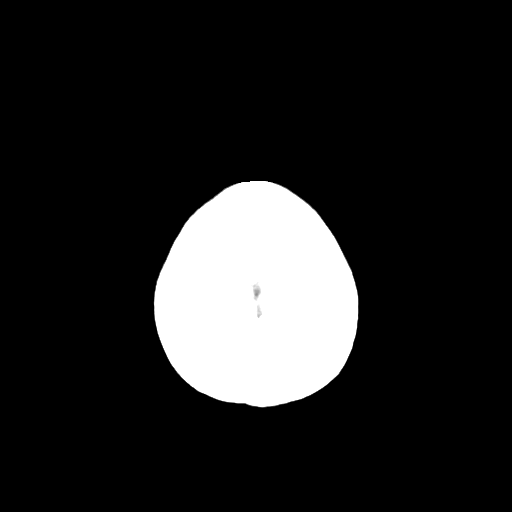
[im 32/36  brain]
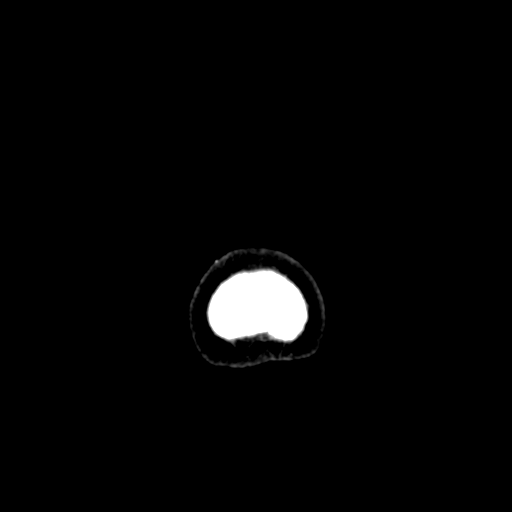
[im 34/36  brain]
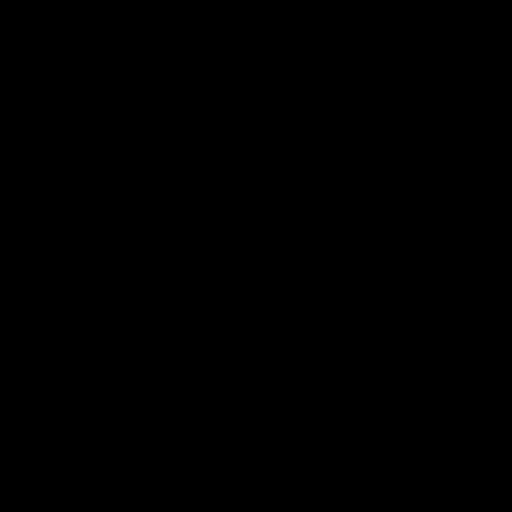

[16 of 30 positions shown; findings below may reference images not displayed]

FINDINGS: There is no evidence of acute intracranial hemorrhage, mass lesion,
brain edema or extra-axial fluid collection. The ventricles and
subarachnoid spaces are N. There is no CT evidence of acute cortical
infarction.

The visualized paranasal sinuses, mastoid air cells and middle ears
are clear. The calvarium is intact. Prominence of the nasopharyngeal
soft tissues likely represents adenoid hypertrophy.
IMPRESSION: Prominence of the nasopharyngeal soft tissues, likely adenoid
hypertrophy. Otherwise unremarkable noncontrast head CT.

## 2016-08-23 IMAGING — CR DG FOOT COMPLETE 3+V*R*
3 series · 3 of 3 positions shown · non-contrast
Comparison: None.

CLINICAL DATA: Right foot numbness in the heel in toes for 4 days.
No acute injury or pain. Initial encounter.

EXAM:
RIGHT FOOT COMPLETE - 3+ VIEW

[t foot ap right]
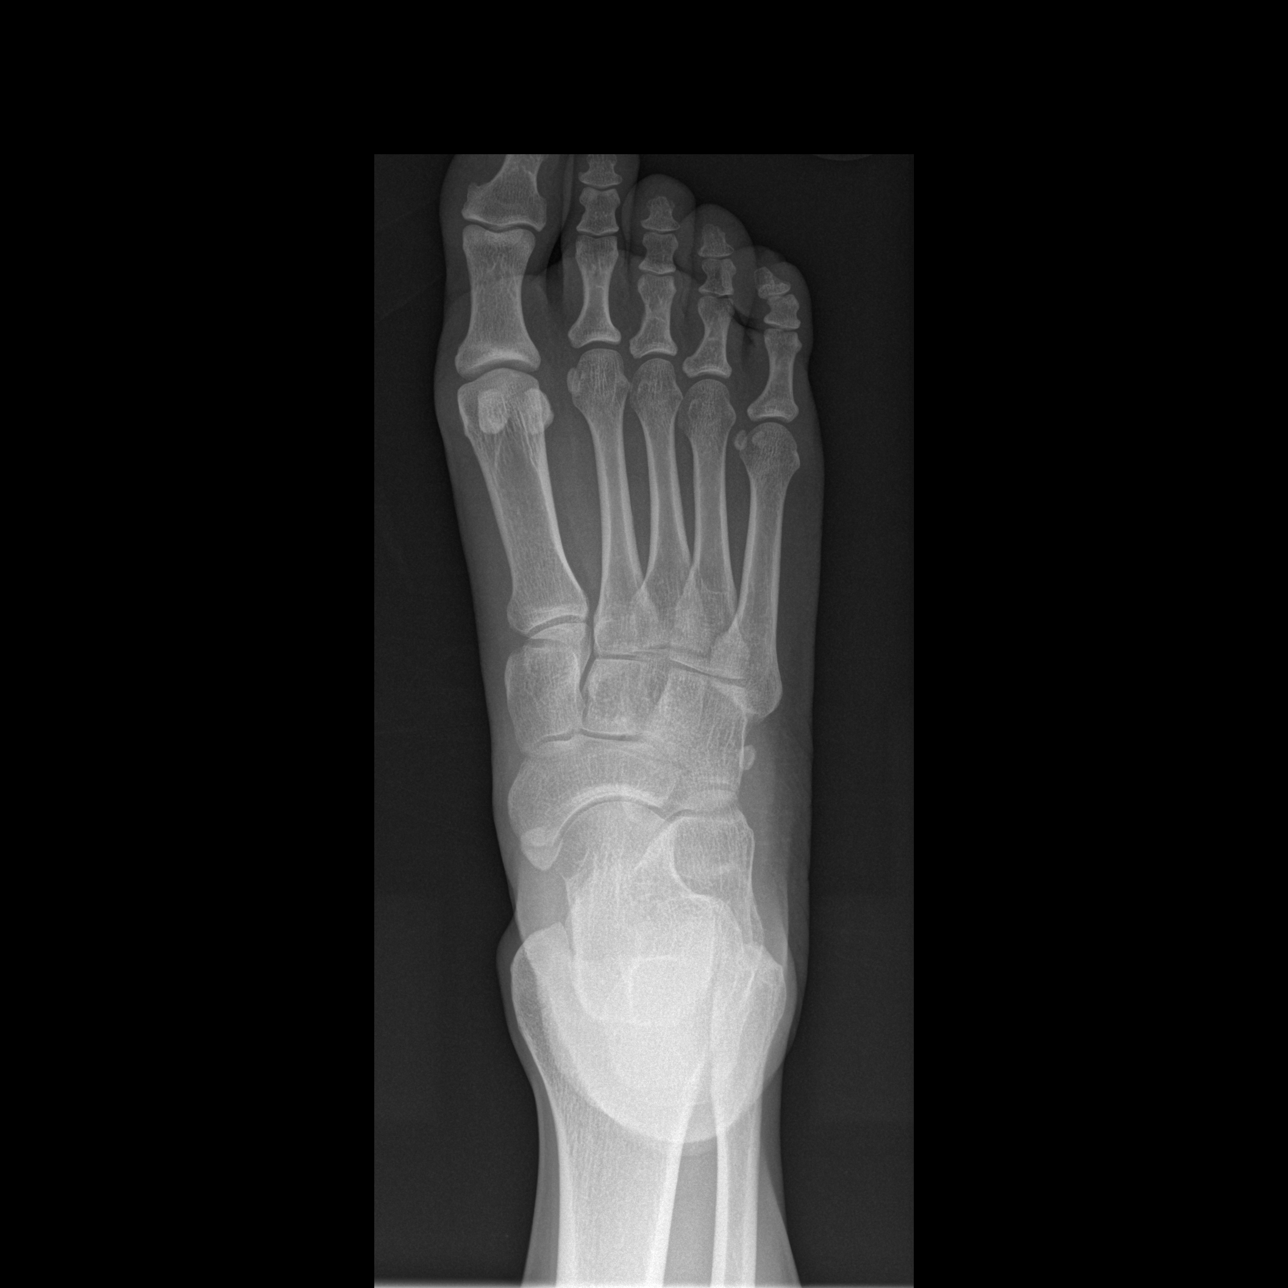

[t foot oblique right]
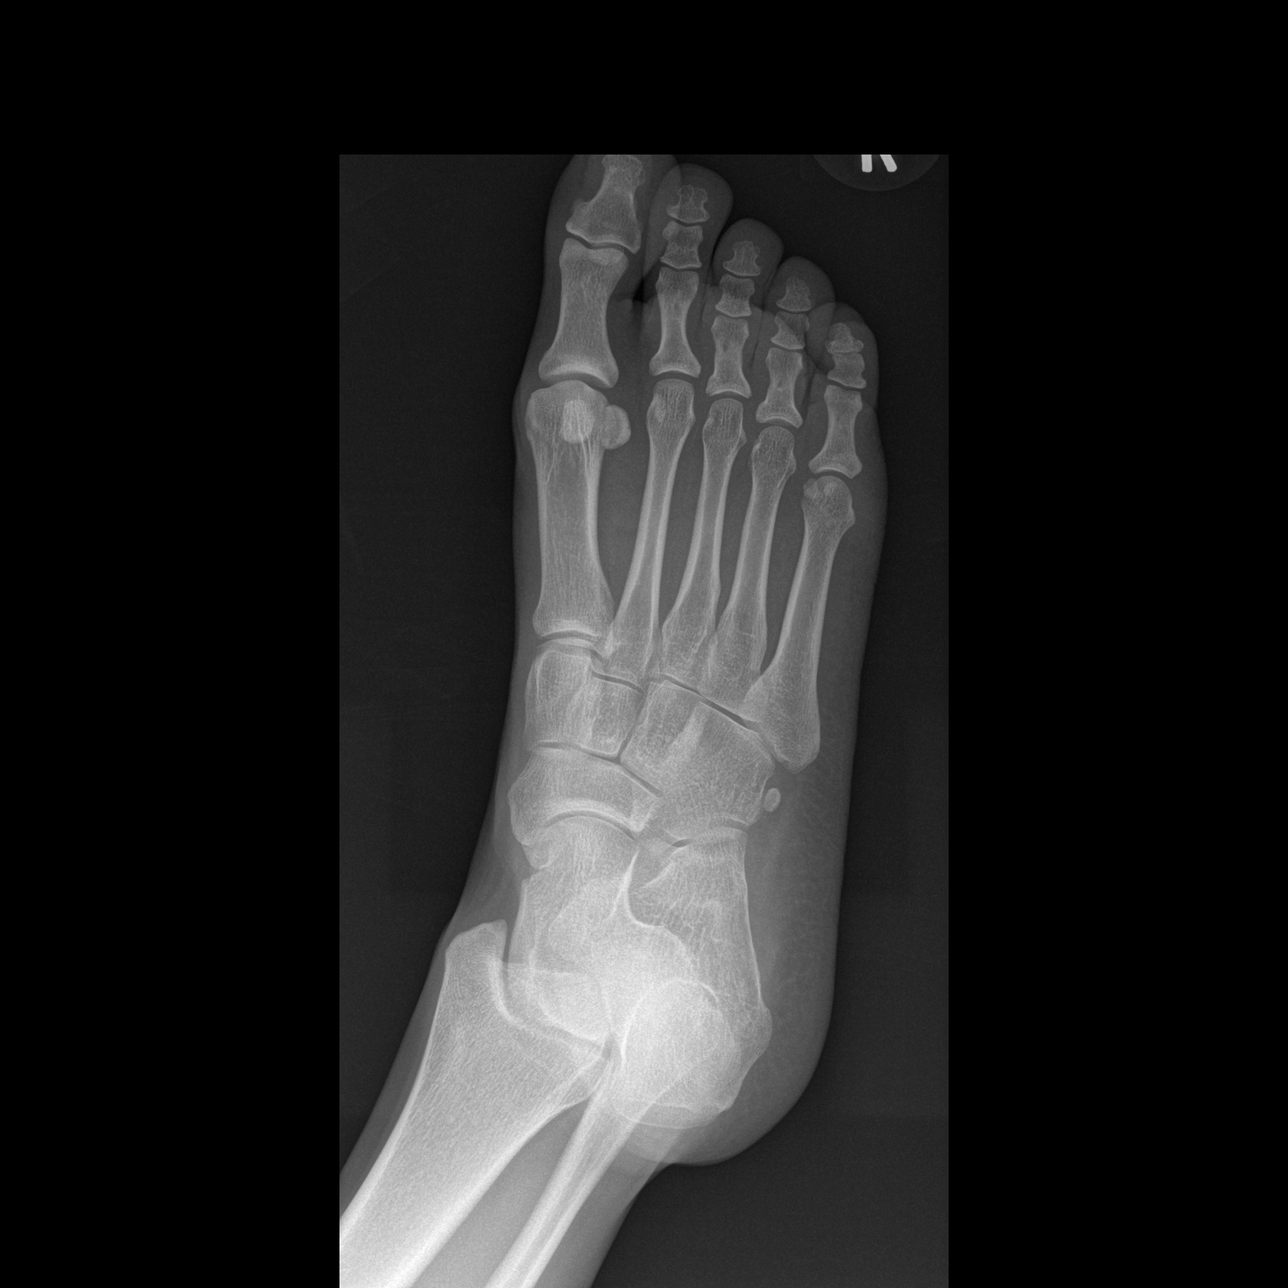

[t foot lat right]
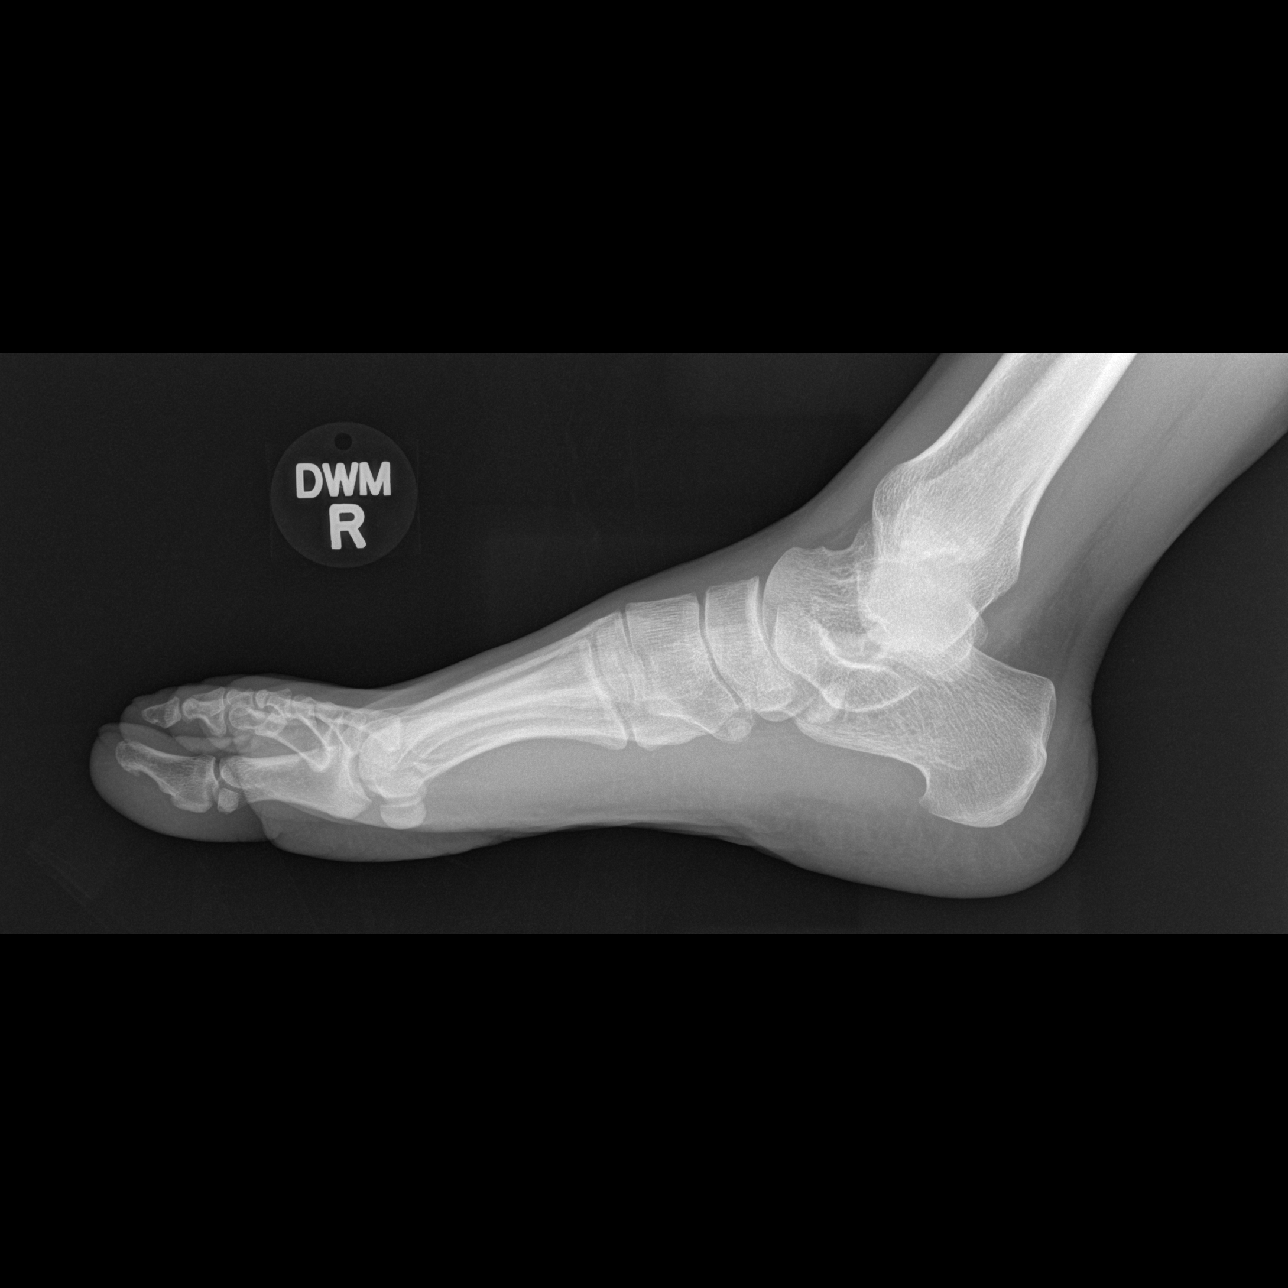

[3 of 3 positions shown; findings below may reference images not displayed]

FINDINGS: The mineralization and alignment are normal. There is no evidence of
acute fracture or dislocation. The joint spaces are maintained. No
focal soft tissue swelling.
IMPRESSION: Normal right foot radiographs.  No evidence of neuropathic change.

## 2017-05-01 ENCOUNTER — Encounter (HOSPITAL_BASED_OUTPATIENT_CLINIC_OR_DEPARTMENT_OTHER): Payer: Self-pay | Admitting: Emergency Medicine

## 2017-05-01 ENCOUNTER — Emergency Department (HOSPITAL_BASED_OUTPATIENT_CLINIC_OR_DEPARTMENT_OTHER)
Admission: EM | Admit: 2017-05-01 | Discharge: 2017-05-01 | Disposition: A | Discharge status: Home / Self Care | Payer: Medicaid Other | Attending: Emergency Medicine | Admitting: Emergency Medicine

## 2017-05-01 DIAGNOSIS — H53149 Visual discomfort, unspecified: Secondary | ICD-10-CM | POA: Diagnosis not present

## 2017-05-01 DIAGNOSIS — F17210 Nicotine dependence, cigarettes, uncomplicated: Secondary | ICD-10-CM | POA: Insufficient documentation

## 2017-05-01 DIAGNOSIS — G4440 Drug-induced headache, not elsewhere classified, not intractable: Secondary | ICD-10-CM

## 2017-05-01 DIAGNOSIS — Z79899 Other long term (current) drug therapy: Secondary | ICD-10-CM | POA: Diagnosis not present

## 2017-05-01 DIAGNOSIS — R51 Headache: Secondary | ICD-10-CM | POA: Diagnosis present

## 2017-05-01 HISTORY — DX: Scotoma involving central area, left eye: H53.412

## 2017-05-01 HISTORY — DX: Depression, unspecified: F32.A

## 2017-05-01 HISTORY — DX: Major depressive disorder, single episode, unspecified: F32.9

## 2017-05-01 NOTE — ED Provider Notes (Signed)
[facility] EMERGENCY DEPARTMENT Provider Note   CSN: [id number] Arrival date & time: 05/01/17  4098     History   Chief Complaint Chief Complaint  Patient presents with  . Headache  . Facial Pain    HPI Stephanie Jefferson is a 31 y.o. female.  31 yo F with a chief complaint of left-sided headache. This been going on for the past 3 months or longer. The patient states that they are left-sided sharp usually to the left frontal region but now on the left maxillary region. Worse with bright lights and loud noises. Denies cough congestion fever. Denies head injury. Denies neck pain. She has been having some left lower dental pain. She also had some transient left-sided chest pain. Lasted for about a second. Spontaneously resolved. She had some paresthesias on the left side that also resolved spontaneously. This was a couple weeks ago. The patient's mother was concerned and so she came to the ED today.  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   The history is provided by the patient.  Headache   This is a new problem. The current episode started more than 1 week ago. The problem occurs constantly. The problem has not changed since onset.The headache is associated with bright light. The pain is located in the left unilateral region. The quality of the pain is described as sharp. The pain is at a severity of 9/10. The pain is moderate. The pain does not radiate. Pertinent negatives include no fever, no palpitations, no shortness of breath, no nausea and no vomiting. She has tried nothing for the symptoms. The treatment provided no relief.    Past Medical History:  Diagnosis Date  . Anxiety   . Depression   . Vision loss, central, left     There are no active problems to display for this patient.   Past Surgical History:    Procedure Laterality Date  . CESAREAN SECTION    . TUBAL LIGATION      OB History    No data available       Home Medications    Prior to Admission medications   Medication Sig Start Date End Date Taking? Authorizing Provider  mirtazapine (REMERON) 15 MG tablet Take 15 mg by mouth at bedtime.   Yes [provider]  diphenoxylate-atropine (LOMOTIL) 2.5-0.025 MG tablet Take 1 tablet by mouth 4 (four) times daily as needed for diarrhea or loose stools. 05/04/15   Nelva Nay, MD    Family History History reviewed. No pertinent family history.  Social History Social History  Substance Use Topics  . Smoking status: Current Every Day Smoker    Packs/day: 0.50    Types: Cigarettes  . Smokeless tobacco: Never Used  . Alcohol use Yes     Comment: occ     Allergies   Patient has no known allergies.   Review of Systems Review of Systems  Constitutional: Negative for chills and fever.  HENT: Negative for congestion and rhinorrhea.   Eyes: Positive for photophobia. Negative for redness and visual disturbance.  Respiratory: Negative for shortness of breath and wheezing.   Cardiovascular: Negative for chest pain and palpitations.  Gastrointestinal: Negative for nausea and vomiting.  Genitourinary: Negative for dysuria and urgency.  Musculoskeletal: Negative for arthralgias and myalgias.  Skin: Negative for pallor and wound.  Neurological: Positive for headaches. Negative for dizziness.     Physical Exam Updated Vital Signs BP (!) 144/95 (BP Location: Right Arm)   Pulse 77   Temp 98.2 F (36.8 C) (Oral)   Resp 18   Ht  (1.499 m)   Wt 54.4 kg (120 lb)   LMP 04/29/2017   SpO2 100%   BMI 24.24 kg/m   Physical Exam  Constitutional: She is oriented to person, place, and time. She appears well-developed and well-nourished. No distress.  HENT:  Head: Normocephalic and atraumatic.  TMs are normal bilaterally. No sinus tenderness to percussion.Teeth  with diffuse caries but no pain to percussion. No fluctuance or edema.  Eyes: Pupils are equal, round, and reactive to light. EOM are normal.  Neck: Normal range of motion. Neck supple.  Cardiovascular: Normal rate and regular rhythm.  Exam reveals no gallop and no friction rub.   No murmur heard. Pulmonary/Chest: Effort normal. She has no wheezes. She has no rales.  Abdominal: Soft. She exhibits no distension. There is no tenderness.  Musculoskeletal: She exhibits no edema or tenderness.  Neurological: She is alert and oriented to person, place, and time. She has normal strength. No cranial nerve deficit or sensory deficit. She displays a negative Romberg sign. Coordination and gait normal. GCS eye subscore is 4. GCS verbal subscore is 5. GCS motor subscore is 6.  Reflex Scores:      Tricep reflexes are 2+ on the right side and 2+ on the left side.      Bicep reflexes are 2+ on the right side and 2+ on the left side.      Brachioradialis reflexes are 2+ on the right side and 2+ on the left side.      Patellar reflexes are 2+ on the right side and 2+ on the left side.      Achilles reflexes are 2+ on the right side and 2+ on the left side. Skin: Skin is warm and dry. She is not diaphoretic.  Psychiatric: She has a normal mood and affect. Her behavior is normal.  Nursing note and vitals reviewed.    ED Treatments / Results  Labs (all labs ordered are listed, but only abnormal results are displayed) Labs Reviewed - No data to display  EKG  EKG Interpretation  Date/Time:  Tuesday May 01 2017 10:35:00 EDT Ventricular Rate:  67 PR Interval:    QRS Duration: 91 QT Interval:  390 QTC Calculation: 412 R Axis:   86 Text Interpretation:  Sinus rhythm No old tracing to compare Confirmed by Melene Plan [phone]) on 05/01/2017 10:37:07 AM       Radiology No results found.  Procedures Procedures (including critical care time)  Medications Ordered in ED Medications - No data to  display   Initial Impression / Assessment and Plan / ED Course  I have reviewed the triage vital signs and the nursing notes.  Pertinent labs & imaging results that were available during my care of the patient were reviewed by me and considered in my medical decision making (see chart for details).     31 yo F with a chief complaint of chronic headaches that sound somewhat migrainous. As I was discussing possible diagnosis with her she did indicate that she has been taking medication to try and prevent a headache. Therefore I feel these are most likely rebound headaches. With a young female with new onset headache syndrome lower for her to a neurologist. She also has a somewhat  complicating factor that she had central visual loss to her left eye.    11:14 AM:  I have discussed the diagnosis/risks/treatment options with the patient and believe the pt to be eligible for discharge home to follow-up with Neuro. We also discussed returning to the ED immediately if new or worsening sx occur. We discussed the sx which are most concerning (e.g., sudden worsening pain, fever, inability to tolerate by mouth) that necessitate immediate return. Medications administered to the patient during their visit and any new prescriptions provided to the patient are listed below.  Medications given during this visit Medications - No data to display   The patient appears reasonably screen and/or stabilized for discharge and I doubt any other medical condition or other [facility] requiring further screening, evaluation, or treatment in the ED at this time prior to discharge.    Final Clinical Impressions(s) / ED Diagnoses   Final diagnoses:  Rebound headache    New Prescriptions New Prescriptions   No medications on file     Melene Plan, DO 05/01/17 1114

## 2017-05-01 NOTE — ED Triage Notes (Addendum)
Pt c/o HAs and LT side facial pain x 1 mo; also reports LT side chest pain intermittently x 2 wks. Sts she is under a lot of stress at this time.

## 2017-05-01 NOTE — Discharge Instructions (Signed)
Follow up with a neurologist for further workup.  Take tylenol or ibuprofen only when you have a headache.

## 2017-05-04 ENCOUNTER — Encounter: Payer: Self-pay | Admitting: Neurology

## 2017-08-17 ENCOUNTER — Ambulatory Visit: Payer: Medicaid Other | Admitting: Neurology

## 2017-10-21 ENCOUNTER — Other Ambulatory Visit: Payer: Self-pay

## 2017-10-21 ENCOUNTER — Encounter (HOSPITAL_BASED_OUTPATIENT_CLINIC_OR_DEPARTMENT_OTHER): Payer: Self-pay | Admitting: *Deleted

## 2017-10-21 ENCOUNTER — Emergency Department (HOSPITAL_BASED_OUTPATIENT_CLINIC_OR_DEPARTMENT_OTHER)
Admission: EM | Admit: 2017-10-21 | Discharge: 2017-10-22 | Disposition: A | Discharge status: Home / Self Care | Payer: Medicaid Other | Attending: Emergency Medicine | Admitting: Emergency Medicine

## 2017-10-21 DIAGNOSIS — Z79899 Other long term (current) drug therapy: Secondary | ICD-10-CM | POA: Insufficient documentation

## 2017-10-21 DIAGNOSIS — R102 Pelvic and perineal pain: Secondary | ICD-10-CM | POA: Diagnosis present

## 2017-10-21 DIAGNOSIS — N898 Other specified noninflammatory disorders of vagina: Secondary | ICD-10-CM | POA: Insufficient documentation

## 2017-10-21 DIAGNOSIS — Z711 Person with feared health complaint in whom no diagnosis is made: Secondary | ICD-10-CM | POA: Insufficient documentation

## 2017-10-21 DIAGNOSIS — F17210 Nicotine dependence, cigarettes, uncomplicated: Secondary | ICD-10-CM | POA: Insufficient documentation

## 2017-10-21 LAB — URINALYSIS, ROUTINE W REFLEX MICROSCOPIC
Bilirubin Urine: NEGATIVE
Glucose, UA: NEGATIVE mg/dL
Ketones, ur: 15 mg/dL — AB
Nitrite: NEGATIVE
PH: 6 (ref 5.0–8.0)
Protein, ur: NEGATIVE mg/dL
SPECIFIC GRAVITY, URINE: 1.025 (ref 1.005–1.030)

## 2017-10-21 LAB — URINALYSIS, MICROSCOPIC (REFLEX)

## 2017-10-21 LAB — PREGNANCY, URINE: Preg Test, Ur: NEGATIVE

## 2017-10-21 MED ORDER — KETOROLAC TROMETHAMINE 30 MG/ML IJ SOLN
30.0000 mg | Freq: Once | INTRAMUSCULAR | Status: AC
  Administered 2017-10-22: 30 mg via INTRAMUSCULAR
  Filled 2017-10-21: qty 1

## 2017-10-21 NOTE — ED Triage Notes (Signed)
Pt reports constipation and vaginal d/c since Friday

## 2017-10-22 ENCOUNTER — Other Ambulatory Visit: Payer: Self-pay

## 2017-10-22 LAB — WET PREP, GENITAL
SPERM: NONE SEEN
TRICH WET PREP: NONE SEEN
Yeast Wet Prep HPF POC: NONE SEEN

## 2017-10-22 MED ORDER — DOXYCYCLINE HYCLATE 100 MG PO CAPS: 100.0000 mg | ORAL_CAPSULE | Freq: Two times a day (BID) | ORAL | 0 refills | Status: AC

## 2017-10-22 MED ORDER — CEFTRIAXONE SODIUM 250 MG IJ SOLR
250.0000 mg | Freq: Once | INTRAMUSCULAR | Status: AC
  Administered 2017-10-22: 250 mg via INTRAMUSCULAR
  Filled 2017-10-22: qty 250

## 2017-10-22 MED ORDER — AZITHROMYCIN 250 MG PO TABS
1000.0000 mg | ORAL_TABLET | Freq: Once | ORAL | Status: AC
  Administered 2017-10-22: 1000 mg via ORAL
  Filled 2017-10-22: qty 4

## 2017-10-22 MED ORDER — NAPROXEN 500 MG PO TABS: 500.0000 mg | ORAL_TABLET | Freq: Two times a day (BID) | ORAL | 0 refills | Status: AC

## 2017-10-22 NOTE — ED Provider Notes (Addendum)
[facility] EMERGENCY DEPARTMENT Provider Note   CSN: [id number] Arrival date & time: 10/21/17  2145     History   Chief Complaint Chief Complaint  Patient presents with  . Abdominal Pain    HPI Stephanie Jefferson is a 32 y.o. female.  HPI  This is a 32 year old female who presents with lower abdominal pain and vaginal discharge.  Patient reports that she had unprotected sex with a regular partner 10 days ago.  She started her period after that and had some pelvic discomfort.  She states "it felt like cramps."  She thought it was related to her period.  She took ibuprofen with some relief.  Her menstrual cycle finished on Wednesday but she has had persistent lower abdominal cramping.  It does not lateralize.  Since that time she has noted increased vaginal discharge.  Denies any fevers, nausea, vomiting, diarrhea, dysuria.  Past Medical History:  Diagnosis Date  . Anxiety   . Depression   . Vision loss, central, left     There are no active problems to display for this patient.   Past Surgical History:  Procedure Laterality Date  . CESAREAN SECTION    . TUBAL LIGATION       OB History   None      Home Medications    Prior to Admission medications   Medication Sig Start Date End Date Taking? Authorizing Provider  diphenoxylate-atropine (LOMOTIL) 2.5-0.025 MG tablet Take 1 tablet by mouth 4 (four) times daily as needed for diarrhea or loose stools. 05/04/15   Nelva Nay, MD  doxycycline (VIBRAMYCIN) 100 MG capsule Take 1 capsule (100 mg total) by mouth 2 (two) times daily. 10/22/17   Joycie Aerts, Mayer Masker, MD  mirtazapine (REMERON) 15 MG tablet Take 15 mg by mouth at bedtime.    [provider]  naproxen (NAPROSYN) 500 MG tablet Take 1 tablet (500 mg total) by mouth 2 (two) times daily. 10/22/17   Sigismund Cross, Mayer Masker, MD    Family History No family history on file.  Social History Social History   Tobacco Use  . Smoking status: Current Every Day  Smoker    Packs/day: 0.50    Types: Cigarettes  . Smokeless tobacco: Never Used  Substance Use Topics  . Alcohol use: Yes    Comment: occ  . Drug use: No     Allergies   Patient has no known allergies.   Review of Systems Review of Systems  Constitutional: Negative for fever.  Respiratory: Negative for shortness of breath.   Cardiovascular: Negative for chest pain.  Gastrointestinal: Positive for abdominal pain. Negative for nausea and vomiting.  Genitourinary: Positive for vaginal discharge.  Musculoskeletal: Negative for back pain.  All other systems reviewed and are negative.    Physical Exam Updated Vital Signs BP (!) 152/90 (BP Location: Left Arm)   Pulse 85   Temp 98.5 F (36.9 C) (Oral)   Resp 16   Ht 4\' 11"  (1.499 m)   Wt 54.4 kg (120 lb)   LMP 10/12/2017   SpO2 100%   BMI 24.24 kg/m   Physical Exam  Constitutional: She is oriented to person, place, and time. She appears well-developed and well-nourished. She does not appear ill.  HENT:  Head: Normocephalic and atraumatic.  Neck: Neck supple.  Cardiovascular: Normal rate, regular rhythm and normal heart sounds.  Pulmonary/Chest: Effort normal. No respiratory distress. She has no wheezes.  Abdominal: Soft. Normal appearance and bowel sounds are normal.  Genitourinary: Vagina  normal. Uterus is tender. Cervix exhibits motion tenderness and discharge. Cervix exhibits no friability. Right adnexum displays no mass and no tenderness. Left adnexum displays no mass and no tenderness.  Genitourinary Comments: Copious thin yellowish vaginal discharge noted, positive cervical motion tenderness, diffuse uterine tenderness, no masses, no lateralizing tenderness or adnexal pain  Neurological: She is alert and oriented to person, place, and time.  Skin: Skin is warm and dry.  Psychiatric: She has a normal mood and affect.  Nursing note and vitals reviewed.    ED Treatments / Results  Labs (all labs ordered are  listed, but only abnormal results are displayed) Labs Reviewed  WET PREP, GENITAL - Abnormal; Notable for the following components:      Result Value   Clue Cells Wet Prep HPF POC PRESENT (*)    WBC, Wet Prep HPF POC MANY (*)    All other components within normal limits  URINALYSIS, ROUTINE W REFLEX MICROSCOPIC - Abnormal; Notable for the following components:   APPearance CLOUDY (*)    Hgb urine dipstick TRACE (*)    Ketones, ur 15 (*)    Leukocytes, UA MODERATE (*)    All other components within normal limits  URINALYSIS, MICROSCOPIC (REFLEX) - Abnormal; Notable for the following components:   Bacteria, UA FEW (*)    Squamous Epithelial / LPF 6-30 (*)    All other components within normal limits  PREGNANCY, URINE  GC/CHLAMYDIA PROBE AMP ([place]) NOT AT [facility]    EKG None  Radiology No results found.  Procedures Procedures (including critical care time)  Medications Ordered in ED Medications  ketorolac (TORADOL) 30 MG/ML injection 30 mg (30 mg Intramuscular Given 10/22/17 0019)  cefTRIAXone (ROCEPHIN) injection 250 mg (250 mg Intramuscular Given 10/22/17 0037)  azithromycin (ZITHROMAX) tablet 1,000 mg (1,000 mg Oral Given 10/22/17 0036)     Initial Impression / Assessment and Plan / ED Course  I have reviewed the triage vital signs and the nursing notes.  Pertinent labs & imaging results that were available during my care of the patient were reviewed by me and considered in my medical decision making (see chart for details).     Patient presents with lower abdominal pain and vaginal discharge.  Pain started after intercourse was unprotected.  She is overall nontoxic.  Vital signs reassuring.  Afebrile.  Pelvic exam would be concerning for possible pelvic inflammatory disease given vaginal discharge and cervical motion tenderness.  She is otherwise well-appearing.  Patient was given Rocephin and azithromycin to cover for STDs.  Testing is pending.  Will place on doxycycline.   Urinalysis without evidence of obvious urinary tract infection.  Given that she is well-appearing and without lateralizing tenderness or mass, less suspicious for TOA. she was counseled regarding safe sex practices.  After history, exam, and medical workup I feel the patient has been appropriately medically screened and is safe for discharge home. Pertinent diagnoses were discussed with the patient. Patient was given return precautions.   Final Clinical Impressions(s) / ED Diagnoses   Final diagnoses:  Pelvic pain in female  Concern about STD in female without diagnosis    ED Discharge Orders        Ordered    doxycycline (VIBRAMYCIN) 100 MG capsule  2 times daily     10/22/17 0108    naproxen (NAPROSYN) 500 MG tablet  2 times daily     10/22/17 0110       Ziyanna Tolin, Mayer Masker, MD 10/22/17 [phone]  Shon BatonHorton, Jairo Bellew F, MD 10/22/17 [phone]

## 2017-10-22 NOTE — Discharge Instructions (Signed)
You were seen today for pelvic pain and vaginal discharge.  You were tested and treated for STDs.  You could have a condition called pelvic inflammatory disease.  Take antibiotics as prescribed.  Take naproxen as needed for pain.  Do not engage in sexual activity for the next 10 days.  Always use condoms.

## 2017-10-23 LAB — GC/CHLAMYDIA PROBE AMP (~~LOC~~) NOT AT ARMC
Chlamydia: NEGATIVE
Neisseria Gonorrhea: POSITIVE — AB

## 2018-07-28 ENCOUNTER — Encounter (HOSPITAL_BASED_OUTPATIENT_CLINIC_OR_DEPARTMENT_OTHER): Payer: Self-pay | Admitting: Emergency Medicine

## 2018-07-28 ENCOUNTER — Other Ambulatory Visit: Payer: Self-pay

## 2018-07-28 ENCOUNTER — Emergency Department (HOSPITAL_BASED_OUTPATIENT_CLINIC_OR_DEPARTMENT_OTHER)
Admission: EM | Admit: 2018-07-28 | Discharge: 2018-07-28 | Disposition: A | Discharge status: Home / Self Care | Payer: Medicaid Other | Attending: Emergency Medicine | Admitting: Emergency Medicine

## 2018-07-28 DIAGNOSIS — F17200 Nicotine dependence, unspecified, uncomplicated: Secondary | ICD-10-CM | POA: Insufficient documentation

## 2018-07-28 DIAGNOSIS — Z79899 Other long term (current) drug therapy: Secondary | ICD-10-CM | POA: Diagnosis not present

## 2018-07-28 DIAGNOSIS — N760 Acute vaginitis: Secondary | ICD-10-CM | POA: Insufficient documentation

## 2018-07-28 DIAGNOSIS — N898 Other specified noninflammatory disorders of vagina: Secondary | ICD-10-CM | POA: Diagnosis present

## 2018-07-28 DIAGNOSIS — B9689 Other specified bacterial agents as the cause of diseases classified elsewhere: Secondary | ICD-10-CM | POA: Diagnosis not present

## 2018-07-28 DIAGNOSIS — B3731 Acute candidiasis of vulva and vagina: Secondary | ICD-10-CM

## 2018-07-28 DIAGNOSIS — B373 Candidiasis of vulva and vagina: Secondary | ICD-10-CM | POA: Diagnosis not present

## 2018-07-28 LAB — WET PREP, GENITAL
SPERM: NONE SEEN
TRICH WET PREP: NONE SEEN

## 2018-07-28 LAB — URINALYSIS, COMPLETE (UACMP) WITH MICROSCOPIC
BILIRUBIN URINE: NEGATIVE
Glucose, UA: NEGATIVE mg/dL
Ketones, ur: 40 mg/dL — AB
Leukocytes, UA: NEGATIVE
Nitrite: NEGATIVE
PH: 7 (ref 5.0–8.0)
Protein, ur: NEGATIVE mg/dL
Specific Gravity, Urine: 1.02 (ref 1.005–1.030)

## 2018-07-28 LAB — PREGNANCY, URINE: Preg Test, Ur: NEGATIVE

## 2018-07-28 MED ORDER — FLUCONAZOLE 50 MG PO TABS
150.0000 mg | ORAL_TABLET | Freq: Once | ORAL | Status: AC
  Administered 2018-07-28: 13:00:00 150 mg via ORAL
  Filled 2018-07-28: qty 1

## 2018-07-28 MED ORDER — CEFTRIAXONE SODIUM 250 MG IJ SOLR
250.0000 mg | Freq: Once | INTRAMUSCULAR | Status: AC
  Administered 2018-07-28: 250 mg via INTRAMUSCULAR
  Filled 2018-07-28: qty 250

## 2018-07-28 MED ORDER — AZITHROMYCIN 250 MG PO TABS
1000.0000 mg | ORAL_TABLET | Freq: Once | ORAL | Status: AC
  Administered 2018-07-28: 1000 mg via ORAL
  Filled 2018-07-28: qty 4

## 2018-07-28 MED ORDER — METRONIDAZOLE 500 MG PO TABS: 500.0000 mg | ORAL_TABLET | Freq: Two times a day (BID) | ORAL | 0 refills | Status: AC

## 2018-07-28 NOTE — ED Provider Notes (Signed)
[facility] EMERGENCY DEPARTMENT Provider Note   CSN: [id number] Arrival date & time: 07/28/18  4132     History   Chief Complaint Chief Complaint  Patient presents with  . Vaginal Discharge    HPI Stephanie Jefferson is a 33 y.o. female with past medical history of anxiety, depression, bacterial vaginosis, presenting to the emergency department with complaint of genital discharge and irritation x1 week.  Patient states she has an abnormal odor to her discharge and some vaginal itching.  She states this feels similar to her prior history of bacterial vaginosis.  She also thinks she may have a UTI.  States she has strong smelling urine.  States she has to hold her urine for long periods of time at work.  She does not have associated dysuria or increased frequency.  Denies fevers, flank pain, nausea, vomiting..  Pt is currently sexually active with one man, no protection.  No medications tried for symptoms.  She did not attempt to see her OB/GYN prior to her ED visit today. LMP 2.5 weeks ago.  The history is provided by the patient.    Past Medical History:  Diagnosis Date  . Anxiety   . Depression   . Vision loss, central, left     There are no active problems to display for this patient.   Past Surgical History:  Procedure Laterality Date  . CESAREAN SECTION    . TUBAL LIGATION       OB History   No obstetric history on file.      Home Medications    Prior to Admission medications   Medication Sig Start Date End Date Taking? Authorizing Provider  diphenoxylate-atropine (LOMOTIL) 2.5-0.025 MG tablet Take 1 tablet by mouth 4 (four) times daily as needed for diarrhea or loose stools. 05/04/15   Nelva Nay, MD  doxycycline (VIBRAMYCIN) 100 MG capsule Take 1 capsule (100 mg total) by mouth 2 (two) times daily. 10/22/17   Horton, Mayer Masker, MD  metroNIDAZOLE (FLAGYL) 500 MG tablet Take 1 tablet (500 mg total) by mouth 2 (two) times daily. 07/28/18   Harel Repetto, [place]  N, PA-C  mirtazapine (REMERON) 15 MG tablet Take 15 mg by mouth at bedtime.    [provider]  naproxen (NAPROSYN) 500 MG tablet Take 1 tablet (500 mg total) by mouth 2 (two) times daily. 10/22/17   Horton, Mayer Masker, MD    Family History No family history on file.  Social History Social History   Tobacco Use  . Smoking status: Current Every Day Smoker    Packs/day: 0.50    Types: Cigarettes  . Smokeless tobacco: Never Used  Substance Use Topics  . Alcohol use: Yes    Comment: occ  . Drug use: No     Allergies   Patient has no known allergies.   Review of Systems Review of Systems  Constitutional: Negative for fever.  Gastrointestinal: Negative for nausea and vomiting.  Genitourinary: Positive for vaginal discharge. Negative for dysuria, flank pain and frequency.  All other systems reviewed and are negative.    Physical Exam Updated Vital Signs BP 133/86 (BP Location: Left Arm)   Pulse 80   Temp 98.3 F (36.8 C) (Oral)   Resp 16   Ht 4\' 11"  (1.499 m)   Wt 51.3 kg   SpO2 100%   BMI 22.82 kg/m   Physical Exam Vitals signs and nursing note reviewed. Exam conducted with a chaperone present.  Constitutional:      Appearance:  She is well-developed. She is not ill-appearing.  HENT:     Head: Normocephalic and atraumatic.  Eyes:     Conjunctiva/sclera: Conjunctivae normal.  Cardiovascular:     Rate and Rhythm: Normal rate and regular rhythm.  Pulmonary:     Effort: Pulmonary effort is normal.  Abdominal:     General: There is no distension.     Tenderness: There is no abdominal tenderness.  Genitourinary:    Labia:        Right: No tenderness.        Left: No tenderness.      Vagina: Vaginal discharge and erythema present.     Cervix: Discharge present. No cervical motion tenderness or friability.     Uterus: Normal.      Adnexa: Right adnexa normal and left adnexa normal.     Comments: Thick white discharge present. Neurological:     Mental  Status: She is alert.  Psychiatric:        Mood and Affect: Mood normal.        Behavior: Behavior normal.      ED Treatments / Results  Labs (all labs ordered are listed, but only abnormal results are displayed) Labs Reviewed  WET PREP, GENITAL - Abnormal; Notable for the following components:      Result Value   Yeast Wet Prep HPF POC PRESENT (*)    Clue Cells Wet Prep HPF POC PRESENT (*)    WBC, Wet Prep HPF POC MODERATE (*)    All other components within normal limits  URINALYSIS, COMPLETE (UACMP) WITH MICROSCOPIC - Abnormal; Notable for the following components:   Hgb urine dipstick TRACE (*)    Ketones, ur 40 (*)    Bacteria, UA MANY (*)    All other components within normal limits  URINE CULTURE  PREGNANCY, URINE  GC/CHLAMYDIA PROBE AMP ([place]) NOT AT [facility]    EKG None  Radiology No results found.  Procedures Procedures (including critical care time)  Medications Ordered in ED Medications  fluconazole (DIFLUCAN) tablet 150 mg (150 mg Oral Given 07/28/18 1258)  cefTRIAXone (ROCEPHIN) injection 250 mg (250 mg Intramuscular Given 07/28/18 1259)  azithromycin (ZITHROMAX) tablet 1,000 mg (1,000 mg Oral Given 07/28/18 1258)     Initial Impression / Assessment and Plan / ED Course  I have reviewed the triage vital signs and the nursing notes.  Pertinent labs & imaging results that were available during my care of the patient were reviewed by me and considered in my medical decision making (see chart for details).     Pt w vaginal discomfort and discharge. Sexually active without protection. Afebrile. Pelvic exam with thick discharge and vaginal erythema. Wet prep with yeast and signs of BV. Treated with diflucan.  Pt not concerning for PID because hemodynamically stable and no cervical motion tenderness on pelvic exam. Urine with bacteria however no leuks or white cells. Culture sent. Due to pt's history, pelvic exam, and wet prep with increased WBCs, pt has been  treated prophylactically with Azithromycin and Rocephin . Will discharge with flagyl for Bacterial Vaginosis.  Patient to be discharged with instructions to follow up with OBGYN. Discussed importance of using protection when sexually active. Pt understands that they have GC/Chlamydia cultures pending and that they will need to inform all sexual partners if results return positive. Pt has been advised to not drink alcohol while on this medication. Pt afebrile and nontoxic, safe for discharge home.  Discussed results, findings, treatment and follow up.  Patient advised of return precautions. Patient verbalized understanding and agreed with plan.   Final Clinical Impressions(s) / ED Diagnoses   Final diagnoses:  Vaginal yeast infection  BV (bacterial vaginosis)    ED Discharge Orders         Ordered    metroNIDAZOLE (FLAGYL) 500 MG tablet  2 times daily     07/28/18 1248           Jaxten Brosh, [place] N, [place] JerseyPA-C 07/28/18 1306    Vanetta MuldersZackowski, Scott, MD 08/01/18 [phone]

## 2018-07-28 NOTE — Discharge Instructions (Addendum)
Please read the instructions below. Talk with your primary care provider about any new medications.  You have been treated today for gonorrhea and chlamydia. You have also been treated for a yeast infection. Please schedule an appointment for follow up with your OBGYN or primary care. Finish your antibiotic (Flagyl/Metronidazole) as prescribed for bacterial vaginosis. Do not drink alcohol with this medication as it will cause vomiting. You will receive a call from the hospital if your test results come back positive. Avoid sexual activity until you know your test results. If your results come back positive, it is important that you inform all of your sexual partners. Return to the ER for new or worsening symptoms.

## 2018-07-28 NOTE — ED Triage Notes (Signed)
Vaginal discharge and irritation x 1 week.

## 2018-07-28 NOTE — ED Notes (Signed)
Pt moved to fasttrack for med hold

## 2018-07-29 LAB — GC/CHLAMYDIA PROBE AMP (~~LOC~~) NOT AT ARMC
CHLAMYDIA, DNA PROBE: NEGATIVE
Neisseria Gonorrhea: NEGATIVE

## 2018-07-30 LAB — URINE CULTURE

## 2019-07-30 ENCOUNTER — Emergency Department (HOSPITAL_BASED_OUTPATIENT_CLINIC_OR_DEPARTMENT_OTHER)
Admission: EM | Admit: 2019-07-30 | Discharge: 2019-07-30 | Disposition: A | Discharge status: Home / Self Care | Payer: Medicaid Other | Attending: Emergency Medicine | Admitting: Emergency Medicine

## 2019-07-30 ENCOUNTER — Encounter (HOSPITAL_BASED_OUTPATIENT_CLINIC_OR_DEPARTMENT_OTHER): Payer: Self-pay | Admitting: Emergency Medicine

## 2019-07-30 ENCOUNTER — Other Ambulatory Visit: Payer: Self-pay

## 2019-07-30 DIAGNOSIS — F17210 Nicotine dependence, cigarettes, uncomplicated: Secondary | ICD-10-CM | POA: Diagnosis not present

## 2019-07-30 DIAGNOSIS — N760 Acute vaginitis: Secondary | ICD-10-CM | POA: Insufficient documentation

## 2019-07-30 DIAGNOSIS — Z79899 Other long term (current) drug therapy: Secondary | ICD-10-CM | POA: Diagnosis not present

## 2019-07-30 DIAGNOSIS — B3731 Acute candidiasis of vulva and vagina: Secondary | ICD-10-CM

## 2019-07-30 DIAGNOSIS — B373 Candidiasis of vulva and vagina: Secondary | ICD-10-CM | POA: Insufficient documentation

## 2019-07-30 DIAGNOSIS — N899 Noninflammatory disorder of vagina, unspecified: Secondary | ICD-10-CM | POA: Diagnosis present

## 2019-07-30 DIAGNOSIS — B9689 Other specified bacterial agents as the cause of diseases classified elsewhere: Secondary | ICD-10-CM

## 2019-07-30 LAB — PREGNANCY, URINE: Preg Test, Ur: NEGATIVE

## 2019-07-30 LAB — URINALYSIS, MICROSCOPIC (REFLEX)

## 2019-07-30 LAB — URINALYSIS, ROUTINE W REFLEX MICROSCOPIC
Bilirubin Urine: NEGATIVE
Glucose, UA: NEGATIVE mg/dL
Ketones, ur: NEGATIVE mg/dL
Nitrite: NEGATIVE
Protein, ur: NEGATIVE mg/dL
Specific Gravity, Urine: 1.02 (ref 1.005–1.030)
pH: 6 (ref 5.0–8.0)

## 2019-07-30 LAB — WET PREP, GENITAL
Sperm: NONE SEEN
Trich, Wet Prep: NONE SEEN

## 2019-07-30 MED ORDER — FLUCONAZOLE 150 MG PO TABS
150.0000 mg | ORAL_TABLET | Freq: Once | ORAL | Status: AC
  Administered 2019-07-30: 10:00:00 150 mg via ORAL
  Filled 2019-07-30: qty 1

## 2019-07-30 MED ORDER — FLUCONAZOLE 150 MG PO TABS: 150.0000 mg | ORAL_TABLET | Freq: Once | ORAL | 0 refills | Status: AC

## 2019-07-30 MED FILL — FLUCONAZOLE 150 MG TABS: 150 | 1 days supply | Qty: 1 | Fill #0

## 2019-07-30 NOTE — ED Notes (Signed)
ED Provider at bedside. 

## 2019-07-30 NOTE — Discharge Instructions (Signed)
You were evaluated in the emergency department for vaginal itching.  Your test showed that you had yeast and BV.  You were given a dose of Diflucan here for the yeast infection.  Please take your BV medication.  If you still have symptoms in 3 days you can take the repeat dose of Diflucan for the yeast infection.  You can still use some topical yeast medicine to help with itching.

## 2019-07-30 NOTE — ED Provider Notes (Signed)
[facility] EMERGENCY DEPARTMENT Provider Note   CSN: [id number] Arrival date & time: 07/30/19  8329     History Chief Complaint  Patient presents with  . vaginal irritation    Stephanie Jefferson is a 34 y.o. female.  She is complaining of vaginal discharge and itching been going on for at least 10 days.  She said she went to another emergency provider was told she has yeast and BV and was treated with Diflucan and Flagyl.  She then started with her period and once her period slowed down she said her vaginal itching and irritation recurred.  No fevers or chills.  She still having a little bit of vaginal pink bleeding.  No abdominal pain.  Still has some dysuria.  The history is provided by the patient.  Vaginal Discharge Vaginal discharge characteristics: pink. Severity:  Mild Onset quality:  Gradual Duration:  10 days Timing:  Intermittent Progression:  Unchanged Chronicity:  Recurrent Context: recent antibiotic use   Relieved by:  Nothing Worsened by:  Nothing Ineffective treatments:  Prescription medications Associated symptoms: dysuria and vaginal itching   Associated symptoms: no abdominal pain, no fever, no nausea, no urinary frequency and no vomiting        Past Medical History:  Diagnosis Date  . Anxiety   . Depression   . Vision loss, central, left     There are no problems to display for this patient.   Past Surgical History:  Procedure Laterality Date  . CESAREAN SECTION    . TUBAL LIGATION       OB History   No obstetric history on file.     No family history on file.  Social History   Tobacco Use  . Smoking status: Current Every Day Smoker    Packs/day: 0.50    Types: Cigarettes  . Smokeless tobacco: Never Used  Substance Use Topics  . Alcohol use: Yes    Comment: occ  . Drug use: No    Home Medications Prior to Admission medications   Medication Sig Start Date End Date Taking? Authorizing Provider  diphenoxylate-atropine  (LOMOTIL) 2.5-0.025 MG tablet Take 1 tablet by mouth 4 (four) times daily as needed for diarrhea or loose stools. 05/04/15   Leonard Schwartz, MD  doxycycline (VIBRAMYCIN) 100 MG capsule Take 1 capsule (100 mg total) by mouth 2 (two) times daily. 10/22/17   Horton, Barbette Hair, MD  metroNIDAZOLE (FLAGYL) 500 MG tablet Take 1 tablet (500 mg total) by mouth 2 (two) times daily. 07/28/18   Robinson, [place] N, PA-C  mirtazapine (REMERON) 15 MG tablet Take 15 mg by mouth at bedtime.    [provider]  naproxen (NAPROSYN) 500 MG tablet Take 1 tablet (500 mg total) by mouth 2 (two) times daily. 10/22/17   Horton, Barbette Hair, MD    Allergies    Patient has no known allergies.  Review of Systems   Review of Systems  Constitutional: Negative for fever.  HENT: Negative for sore throat.   Eyes: Negative for visual disturbance.  Respiratory: Negative for shortness of breath.   Cardiovascular: Negative for chest pain.  Gastrointestinal: Negative for abdominal pain, nausea and vomiting.  Genitourinary: Positive for dysuria and vaginal discharge.  Musculoskeletal: Negative for back pain.  Skin: Negative for rash.  Neurological: Negative for headaches.    Physical Exam Updated Vital Signs BP 134/89 (BP Location: Right Arm)   Pulse 87   Temp 98.7 F (37.1 C) (Oral)   Resp 16  Ht 4\' 11"  (1.499 m)   Wt 52.2 kg   LMP 07/23/2019   SpO2 100%   BMI 23.25 kg/m   Physical Exam Vitals and nursing note reviewed. Exam conducted with a chaperone present.  Constitutional:      General: She is not in acute distress.    Appearance: She is well-developed.  HENT:     Head: Normocephalic and atraumatic.  Eyes:     Conjunctiva/sclera: Conjunctivae normal.  Cardiovascular:     Rate and Rhythm: Normal rate and regular rhythm.     Heart sounds: No murmur.  Pulmonary:     Effort: Pulmonary effort is normal. No respiratory distress.     Breath sounds: Normal breath sounds.  Abdominal:     Palpations:  Abdomen is soft.     Tenderness: There is no abdominal tenderness.  Genitourinary:    Labia:        Right: Tenderness present. No rash.        Left: Tenderness present. No rash.      Vagina: Vaginal discharge present. No bleeding.     Cervix: No cervical motion tenderness.     Adnexa:        Right: No mass or tenderness.         Left: No mass or tenderness.       Comments: No external lesions.  She had some thick white discharge along the walls of the vagina.  She had some tenderness mostly at the vaginal opening but no CMT or adnexal tenderness or masses noted.  Nurse 09/20/2019 was chaperone. Musculoskeletal:        General: Normal range of motion.     Cervical back: Neck supple.  Skin:    General: Skin is warm and dry.     Capillary Refill: Capillary refill takes less than 2 seconds.  Neurological:     General: No focal deficit present.     Mental Status: She is alert.     ED Results / Procedures / Treatments   Labs (all labs ordered are listed, but only abnormal results are displayed) Labs Reviewed  WET PREP, GENITAL - Abnormal; Notable for the following components:      Result Value   Yeast Wet Prep HPF POC PRESENT (*)    Clue Cells Wet Prep HPF POC PRESENT (*)    WBC, Wet Prep HPF POC MODERATE (*)    All other components within normal limits  URINALYSIS, ROUTINE W REFLEX MICROSCOPIC - Abnormal; Notable for the following components:   APPearance CLOUDY (*)    Hgb urine dipstick SMALL (*)    Leukocytes,Ua LARGE (*)    All other components within normal limits  URINALYSIS, MICROSCOPIC (REFLEX) - Abnormal; Notable for the following components:   Bacteria, UA MANY (*)    All other components within normal limits  URINE CULTURE  PREGNANCY, URINE  GC/CHLAMYDIA PROBE AMP ([place]) NOT AT [facility]    EKG None  Radiology No results found.  Procedures Procedures (including critical care time)  Medications Ordered in ED Medications  fluconazole (DIFLUCAN) tablet 150 mg  (150 mg Oral Given 07/30/19 0950)    ED Course  I have reviewed the triage vital signs and the nursing notes.  Pertinent labs & imaging results that were available during my care of the patient were reviewed by me and considered in my medical decision making (see chart for details).  Clinical Course as of Jul 29 1712  Wed Jul 30, 2019  [id number] 34 year old  female here with vaginal itching and irritation.  Differential includes BV, yeast, gonorrhea, chlamydia, trichomonas.  She was just treated for gonorrhea and chlamydia and has not been sexually active since then.  Likely has flared up yeast infection.  Wet prep showing yeast and clue cells.  She has a dose of BV left and so we will give her Diflucan here and a prescription for one repeat dose of Diflucan.  Reviewed this with the patient she is comfortable with plan.   [MB]    Clinical Course User Index [MB] Terrilee Files, MD   MDM Rules/Calculators/A&P                       Final Clinical Impression(s) / ED Diagnoses Final diagnoses:  Yeast vaginitis  Bacterial vaginitis    Rx / DC Orders ED Discharge Orders         Ordered    fluconazole (DIFLUCAN) 150 MG tablet   Once     07/30/19 0957           Terrilee Files, MD 07/30/19 1715

## 2019-07-30 NOTE — ED Triage Notes (Signed)
Treated recently for BV and Yeast.  Pt sts she is no better.

## 2019-07-31 LAB — GC/CHLAMYDIA PROBE AMP (~~LOC~~) NOT AT ARMC
Chlamydia: NEGATIVE
Neisseria Gonorrhea: NEGATIVE

## 2019-07-31 LAB — URINE CULTURE: Culture: >=100000 — AB

## 2022-08-31 ENCOUNTER — Emergency Department (HOSPITAL_BASED_OUTPATIENT_CLINIC_OR_DEPARTMENT_OTHER)
Admission: EM | Admit: 2022-08-31 | Discharge: 2022-08-31 | Disposition: A | Discharge status: Home / Self Care | Payer: Medicaid Other | Attending: Emergency Medicine | Admitting: Emergency Medicine

## 2022-08-31 ENCOUNTER — Emergency Department (HOSPITAL_BASED_OUTPATIENT_CLINIC_OR_DEPARTMENT_OTHER): Payer: Medicaid Other

## 2022-08-31 ENCOUNTER — Encounter (HOSPITAL_BASED_OUTPATIENT_CLINIC_OR_DEPARTMENT_OTHER): Payer: Self-pay | Admitting: Urology

## 2022-08-31 DIAGNOSIS — F17210 Nicotine dependence, cigarettes, uncomplicated: Secondary | ICD-10-CM | POA: Diagnosis not present

## 2022-08-31 DIAGNOSIS — U071 COVID-19: Secondary | ICD-10-CM | POA: Insufficient documentation

## 2022-08-31 DIAGNOSIS — Z79899 Other long term (current) drug therapy: Secondary | ICD-10-CM | POA: Insufficient documentation

## 2022-08-31 DIAGNOSIS — I10 Essential (primary) hypertension: Secondary | ICD-10-CM | POA: Insufficient documentation

## 2022-08-31 DIAGNOSIS — R051 Acute cough: Secondary | ICD-10-CM

## 2022-08-31 DIAGNOSIS — R059 Cough, unspecified: Secondary | ICD-10-CM | POA: Diagnosis present

## 2022-08-31 LAB — RESP PANEL BY RT-PCR (RSV, FLU A&B, COVID)  RVPGX2
Influenza A by PCR: NEGATIVE
Influenza B by PCR: NEGATIVE
Resp Syncytial Virus by PCR: NEGATIVE
SARS Coronavirus 2 by RT PCR: POSITIVE — AB

## 2022-08-31 MED ORDER — VENTOLIN HFA 108 (90 BASE) MCG/ACT IN AERS: 1.0000 | INHALATION_SPRAY | Freq: Four times a day (QID) | RESPIRATORY_TRACT | 0 refills | Status: AC | PRN

## 2022-08-31 MED ORDER — PANTOPRAZOLE SODIUM 20 MG PO TBEC: 20.0000 mg | DELAYED_RELEASE_TABLET | Freq: Every day | ORAL | 0 refills | Status: AC

## 2022-08-31 NOTE — ED Provider Notes (Signed)
[facility] Provider Note   CSN: [id number] Arrival date & time: 08/31/22  1220     History  Chief Complaint  Patient presents with   Cough    Stephanie Jefferson is a 37 y.o. female.   Cough   37 year old female presents emergency department with complaints of cough.  Patient states that she has had cough since November of this past year.  Reports cough is intermittently worsening with no known exacerbating or relieving factors.  Patient states that cough has seemed to be worsened early in the morning around 3:40 AM with also worsening at night around 9 to 12 PM.  Denies any known association with food/liquids.  Denies any chronic medications that she takes.  Denies fever, congestion, chest pain, shortness of breath, back pain, abdominal pain, nausea, vomiting, urinary symptoms, change in bowel habits.  Patient has been trying oral allergy medicine of which has not helped.  Past medical history significant for anxiety, depression  Home Medications Prior to Admission medications   Medication Sig Start Date End Date Taking? Authorizing Provider  albuterol (VENTOLIN HFA) 108 (90 Base) MCG/ACT inhaler Inhale 1-2 puffs into the lungs every 6 (six) hours as needed for wheezing or shortness of breath. 08/31/22  Yes Dion Saucier A, PA  pantoprazole (PROTONIX) 20 MG tablet Take 1 tablet (20 mg total) by mouth daily. 08/31/22  Yes Dion Saucier A, PA  diphenoxylate-atropine (LOMOTIL) 2.5-0.025 MG tablet Take 1 tablet by mouth 4 (four) times daily as needed for diarrhea or loose stools. 05/04/15   Leonard Schwartz, MD  doxycycline (VIBRAMYCIN) 100 MG capsule Take 1 capsule (100 mg total) by mouth 2 (two) times daily. 10/22/17   Horton, Barbette Hair, MD  metroNIDAZOLE (FLAGYL) 500 MG tablet Take 1 tablet (500 mg total) by mouth 2 (two) times daily. 07/28/18   Robinson, [place] N, PA-C  mirtazapine (REMERON) 15 MG tablet Take 15 mg by mouth at bedtime.     [provider]  naproxen (NAPROSYN) 500 MG tablet Take 1 tablet (500 mg total) by mouth 2 (two) times daily. 10/22/17   Horton, Barbette Hair, MD      Allergies    Patient has no known allergies.    Review of Systems   Review of Systems  Respiratory:  Positive for cough.   All other systems reviewed and are negative.   Physical Exam Updated Vital Signs BP (!) 148/107 (BP Location: Right Arm)   Pulse 82   Temp 98.2 F (36.8 C) (Oral)   Resp 18   Ht 4' 11"$  (1.499 m)   Wt 52.2 kg   LMP 08/29/2022   SpO2 98%   BMI 23.24 kg/m  Physical Exam Vitals and nursing note reviewed.  Constitutional:      General: She is not in acute distress.    Appearance: She is well-developed.  HENT:     Head: Normocephalic and atraumatic.  Eyes:     Conjunctiva/sclera: Conjunctivae normal.  Cardiovascular:     Rate and Rhythm: Normal rate and regular rhythm.     Heart sounds: No murmur heard. Pulmonary:     Effort: Pulmonary effort is normal. No respiratory distress.     Breath sounds: No stridor. Wheezing present. No rhonchi or rales.     Comments: Mild wheeze auscultated bilateral upper lung fields. Chest:     Chest wall: No tenderness.  Abdominal:     Palpations: Abdomen is soft.     Tenderness: There is no  abdominal tenderness. There is no right CVA tenderness, left CVA tenderness or guarding.  Musculoskeletal:        General: No swelling.     Cervical back: Neck supple.     Right lower leg: No edema.     Left lower leg: No edema.  Skin:    General: Skin is warm and dry.     Capillary Refill: Capillary refill takes less than 2 seconds.  Neurological:     Mental Status: She is alert.  Psychiatric:        Mood and Affect: Mood normal.     ED Results / Procedures / Treatments   Labs (all labs ordered are listed, but only abnormal results are displayed) Labs Reviewed  RESP PANEL BY RT-PCR (RSV, FLU A&B, COVID)  [id number]    EKG None  Radiology DG Chest 2 View  Result  Date: 08/31/2022 CLINICAL DATA:  Chronic cough EXAM: CHEST - 2 VIEW COMPARISON:  None Available. FINDINGS: The cardiomediastinal silhouette is normal There is no focal consolidation or pulmonary edema. There is no pleural effusion or pneumothorax There is no acute osseous abnormality. IMPRESSION: No radiographic evidence of acute cardiopulmonary process. Electronically Signed   By: Valetta Mole M.D.   On: 08/31/2022 12:57    Procedures Procedures    Medications Ordered in ED Medications - No data to display  ED Course/ Medical Decision Making/ A&P                             Medical Decision Making Amount and/or Complexity of Data Reviewed Radiology: ordered.   This patient presents to the ED for concern of cough, this involves an extensive number of treatment options, and is a complaint that carries with it a high risk of complications and morbidity.  The differential diagnosis includes pneumonia, RSV, influenza, COVID, GERD, malignancy, asthma/COPD, bronchitis,   Co morbidities that complicate the patient evaluation  See HPI   Additional history obtained:  Additional history obtained from EMR External records from outside source obtained and reviewed including hospital records   Lab Tests:  N/a   Imaging Studies ordered:  I ordered imaging studies including chest x-ray I independently visualized and interpreted imaging which showed no acute cardiopulmonary abnormalities I agree with the radiologist interpretation   Cardiac Monitoring: / EKG:  The patient was maintained on a cardiac monitor.  I personally viewed and interpreted the cardiac monitored which showed an underlying rhythm of: Sinus rhythm   Consultations Obtained:  N/a   Problem List / ED Course / Critical interventions / Medication management  Cough Reevaluation of the patient showed that the patient stayed the same I have reviewed the patients home medicines and have made adjustments as  needed   Social Determinants of Health:  Chronic cigarette use.  No illicit drug use.   Test / Admission - Considered:  Cough Vitals signs significant for hypertension with blood pressure 148/107.  Recommend follow-up with primary care regarding elevation of blood pressure.. Otherwise within normal range and stable throughout visit. Imaging studies significant for: See above Patient symptoms isolated cough and mildly irritated throat.  Patient taking no medications and no concern for ace/ARB induced.  Patient with noted wheeze very mild upper lung fields bilaterally; will trial albuterol inhaler.  Given positional nature of worsening of cough, somewhat concern for GERD as well so we will trial PPI outpatient.  Chest x-ray clear of abnormalities.  Patient overall well-appearing, no  acute respiratory distress, afebrile.  Recommend follow-up with primary care for reassessment of symptoms.  Treatment plan discussed with patient and she acknowledged understanding was agreeable to said plan. Worrisome signs and symptoms were discussed with the patient, and the patient acknowledged understanding to return to the ED if noticed. Patient was stable upon discharge.          Final Clinical Impression(s) / ED Diagnoses Final diagnoses:  Acute cough    Rx / DC Orders ED Discharge Orders          Ordered    pantoprazole (PROTONIX) 20 MG tablet  Daily        08/31/22 1334    albuterol (VENTOLIN HFA) 108 (90 Base) MCG/ACT inhaler  Every 6 hours PRN        08/31/22 1334              Wilnette Kales, [place] 08/31/22 1341    Elgie Congo, MD 08/31/22 1746

## 2022-08-31 NOTE — Discharge Instructions (Signed)
Note the workup today was overall reassuring.  Chest x-ray was without abnormality.  Recommend using albuterol as needed for wheeze.  Also send in medicine for reflux given your reflux worsening at night and early in the morning is somewhat concerning for GERD or reflux.  Recommend follow-up with primary care for reassessment of your symptoms.  Please do not hesitate to return to emergency department for worrisome signs and symptoms we discussed become apparent.

## 2022-08-31 NOTE — ED Triage Notes (Signed)
Cough x 3 months (since November) only at night, states now some during the day  States throat irritation from cough  States environmental factors make it worse  H/o daily smoker
